# Patient Record
Sex: Male | Born: 1975 | Race: White | Hispanic: No | Marital: Single | Smoking: Never smoker
Health system: Southern US, Community
[De-identification: ages and names within clinical notes are randomized; demographics above are authoritative.]

## PROBLEM LIST (undated history)

## (undated) DIAGNOSIS — M5440 Lumbago with sciatica, unspecified side: Secondary | ICD-10-CM

## (undated) HISTORY — PX: WISDOM TOOTH EXTRACTION: SHX21

## (undated) HISTORY — DX: Lumbago with sciatica, unspecified side: M54.40

---

## 2005-08-26 ENCOUNTER — Ambulatory Visit: Payer: Self-pay | Admitting: Internal Medicine

## 2005-09-01 ENCOUNTER — Ambulatory Visit: Payer: Self-pay | Admitting: Internal Medicine

## 2006-03-01 ENCOUNTER — Ambulatory Visit: Payer: Self-pay | Admitting: Internal Medicine

## 2006-03-10 ENCOUNTER — Ambulatory Visit: Payer: Self-pay | Admitting: Internal Medicine

## 2006-03-22 ENCOUNTER — Ambulatory Visit: Payer: Self-pay | Admitting: Internal Medicine

## 2006-03-23 ENCOUNTER — Encounter: Payer: Self-pay | Admitting: Internal Medicine

## 2006-04-27 ENCOUNTER — Ambulatory Visit: Payer: Self-pay | Admitting: Internal Medicine

## 2006-04-27 ENCOUNTER — Encounter: Payer: Self-pay | Admitting: Internal Medicine

## 2010-07-03 ENCOUNTER — Ambulatory Visit: Payer: Self-pay | Admitting: Internal Medicine

## 2010-07-03 DIAGNOSIS — M549 Dorsalgia, unspecified: Secondary | ICD-10-CM | POA: Insufficient documentation

## 2010-07-03 DIAGNOSIS — R079 Chest pain, unspecified: Secondary | ICD-10-CM

## 2010-07-07 ENCOUNTER — Ambulatory Visit: Payer: Self-pay | Admitting: Internal Medicine

## 2010-12-27 DIAGNOSIS — M5440 Lumbago with sciatica, unspecified side: Secondary | ICD-10-CM

## 2010-12-27 HISTORY — PX: OTHER SURGICAL HISTORY: SHX169

## 2010-12-27 HISTORY — DX: Lumbago with sciatica, unspecified side: M54.40
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
## 2011-01-28 NOTE — Progress Notes (Signed)
Summary: [facility]   Imported By: Lanelle Bal 07/10/2010 09:47:20  _____________________________________________________________________  External Attachment:    Type:   Image     Comment:   External Document

## 2011-11-21 ENCOUNTER — Emergency Department (HOSPITAL_BASED_OUTPATIENT_CLINIC_OR_DEPARTMENT_OTHER)
Admission: EM | Admit: 2011-11-21 | Discharge: 2011-11-21 | Disposition: A | Discharge status: Home / Self Care | Payer: BC Managed Care – PPO | Attending: Emergency Medicine | Admitting: Emergency Medicine

## 2011-11-21 ENCOUNTER — Encounter: Payer: Self-pay | Admitting: Emergency Medicine

## 2011-11-21 DIAGNOSIS — M5430 Sciatica, unspecified side: Secondary | ICD-10-CM

## 2011-11-21 DIAGNOSIS — M549 Dorsalgia, unspecified: Secondary | ICD-10-CM | POA: Insufficient documentation

## 2011-11-21 MED ORDER — OXYCODONE-ACETAMINOPHEN 5-325 MG PO TABS: 2.0000 | ORAL_TABLET | ORAL | Status: AC | PRN

## 2011-11-21 MED ORDER — CYCLOBENZAPRINE HCL 10 MG PO TABS: 10.0000 mg | ORAL_TABLET | Freq: Two times a day (BID) | ORAL | Status: AC | PRN

## 2011-11-21 MED ORDER — CYCLOBENZAPRINE HCL 10 MG PO TABS
10.0000 mg | ORAL_TABLET | Freq: Once | ORAL | Status: AC
  Administered 2011-11-21: 10 mg via ORAL
  Filled 2011-11-21: qty 1

## 2011-11-21 MED ORDER — OXYCODONE-ACETAMINOPHEN 5-325 MG PO TABS
1.0000 | ORAL_TABLET | Freq: Once | ORAL | Status: AC
  Administered 2011-11-21: 1 via ORAL
  Filled 2011-11-21: qty 1

## 2011-11-21 NOTE — ED Provider Notes (Signed)
Medical screening examination/treatment/procedure(s) were performed by non-physician practitioner and as supervising physician I was immediately available for consultation/collaboration.   Britney Newstrom, MD 11/21/11 2143 

## 2011-11-21 NOTE — ED Provider Notes (Signed)
History     CSN: [id number] Arrival date & time: 11/21/2011  6:08 PM   First MD Initiated Contact with Patient 11/21/11 1845      Chief Complaint  Patient presents with  . Back Pain    low back pain with radiation to l leg    (Consider location/radiation/quality/duration/timing/severity/associated sxs/prior treatment) Patient is a 35 y.o. male presenting with back pain. The history is provided by the patient. No language interpreter was used.  Back Pain  This is a new problem. The current episode started more than 2 days ago. The problem occurs constantly. The problem has been rapidly worsening. The pain is associated with no known injury. The pain is present in the gluteal region. The quality of the pain is described as shooting. The pain radiates to the left thigh. The pain is moderate. The symptoms are aggravated by bending and certain positions. The pain is the same all the time. Pertinent negatives include no numbness, no bowel incontinence, no perianal numbness, no dysuria, no tingling and no weakness. He has tried NSAIDs for the symptoms. The treatment provided mild relief.    History reviewed. No pertinent past medical history.  History reviewed. No pertinent past surgical history.  History reviewed. No pertinent family history.  History  Substance Use Topics  . Smoking status: Never Smoker   . Smokeless tobacco: Not on file  . Alcohol Use: No      Review of Systems  Gastrointestinal: Negative for bowel incontinence.  Genitourinary: Negative for dysuria.  Musculoskeletal: Positive for back pain.  Neurological: Negative for tingling, weakness and numbness.  All other systems reviewed and are negative.    Allergies  Aloe vera; Amoxicillin; and Penicillins  Home Medications   Current Outpatient Rx  Name Route Sig Dispense Refill  . IBUPROFEN 800 MG PO TABS Oral Take 800 mg by mouth every 8 (eight) hours as needed.        BP 148/90  Pulse 100  Temp 98.6 F  (37 C)  Resp 22  Wt 185 lb (83.915 kg)  SpO2 98%  Physical Exam  Nursing note and vitals reviewed. Constitutional: He is oriented to person, place, and time. He appears well-developed and well-nourished.  HENT:  Head: Normocephalic and atraumatic.  Cardiovascular: Normal rate and regular rhythm.   Pulmonary/Chest: Effort normal and breath sounds normal.  Musculoskeletal: Normal range of motion.       Pt has left sciatic notch tenderness  Neurological: He is alert and oriented to person, place, and time.    ED Course  Procedures (including critical care time)  Labs Reviewed - No data to display No results found.   1. Sciatica       MDM  Pt is not having any neuro deficits:pt is okay to follow up as needed:no imagining needed        Teressa Lower, NP 11/21/11 1904

## 2011-11-21 NOTE — ED Notes (Signed)
Pt reports low back pain with radiation to L leg denies any Trauma

## 2011-11-21 NOTE — Discharge Instructions (Signed)
Back Exercises  Back exercises help treat and prevent back injuries. The goal is to increase your strength in your belly (abdominal) and back muscles. These exercises can also help with flexibility. Start these exercises when told by your doctor.  HOME CARE  Back exercises include:     Pelvic Tilt.  · Lie on your back with your knees bent. Tilt your pelvis until the lower part of your back is against the floor. Hold this position 5 to 10 sec. Repeat this exercise 5 to 10 times.      Knee to Chest.  · Pull 1 knee up against your chest and hold for 20 to 30 seconds. Repeat this with the other knee. This may be done with the other leg straight or bent, whichever feels better. Then, pull both knees up against your chest.      Sit-Ups or Curl-Ups.  · Bend your knees 90 degrees. Start with tilting your pelvis, and do a partial, slow sit-up. Only lift your upper half 30 to 45 degrees off the floor. Take at least 2 to 3 seonds for each sit-up. Do not do sit-ups with your knees out straight. If partial sit-ups are difficult, simply do the above but with only tightening your belly (abdominal) muscles and holding it as told.      Hip-Lift.  · Lie on your back with your knees flexed 90 degrees. Push down with your feet and shoulders as you raise your hips 2 inches off the floor. Hold for 10 seconds, repeat 5 to 10 times.      Back Arches.  · Lie on your stomach. Prop yourself up on bent elbows. Slowly press on your hands, causing an arch in your low back. Repeat 3 to 5 times.      Shoulder-Lifts.  · Lie face down with arms beside your body. Keep hips and belly pressed to floor as you slowly lift your head and shoulders off the floor.   Do not overdo your exercises. Be careful in the beginning. Exercises may cause you some mild back discomfort. If the pain lasts for more than 15 minutes, stop the exercises until you see your doctor. Improvement with exercise for back problems is slow.   Document Released: 01/15/2011 Document  Revised: 08/25/2011 Document Reviewed: 01/15/2011  [employer]® Patient Information ©2012 [employer].

## 2011-11-21 NOTE — ED Notes (Signed)
Care plan and safe use of Meds reviewed 

## 2011-12-22 ENCOUNTER — Encounter: Payer: Self-pay | Admitting: Internal Medicine

## 2012-01-13 ENCOUNTER — Encounter: Payer: Self-pay | Admitting: Internal Medicine

## 2012-01-13 ENCOUNTER — Ambulatory Visit (INDEPENDENT_AMBULATORY_CARE_PROVIDER_SITE_OTHER): Payer: BC Managed Care – PPO | Admitting: Internal Medicine

## 2012-01-13 VITALS — BP 124/80 | HR 61 | Temp 98.1°F | Resp 12 | Ht 72.0 in | Wt 183.2 lb

## 2012-01-13 DIAGNOSIS — Z23 Encounter for immunization: Secondary | ICD-10-CM

## 2012-01-13 DIAGNOSIS — Z0000 Encounter for general adult medical examination without abnormal findings: Secondary | ICD-10-CM

## 2012-01-13 DIAGNOSIS — J209 Acute bronchitis, unspecified: Secondary | ICD-10-CM

## 2012-01-13 DIAGNOSIS — R062 Wheezing: Secondary | ICD-10-CM

## 2012-01-13 LAB — LIPID PANEL
Cholesterol: 184 mg/dL (ref 0–200)
LDL Cholesterol: 115 mg/dL — ABNORMAL HIGH (ref 0–99)
VLDL: 15 mg/dL (ref 0.0–40.0)

## 2012-01-13 LAB — CBC WITH DIFFERENTIAL/PLATELET
Basophils Relative: 0.6 % (ref 0.0–3.0)
Eosinophils Absolute: 0.2 10*3/uL (ref 0.0–0.7)
HCT: 43.9 % (ref 39.0–52.0)
Hemoglobin: 14.9 g/dL (ref 13.0–17.0)
Lymphocytes Relative: 33.3 % (ref 12.0–46.0)
Lymphs Abs: 2.1 10*3/uL (ref 0.7–4.0)
MCHC: 33.9 g/dL (ref 30.0–36.0)
MCV: 89.4 fl (ref 78.0–100.0)
Monocytes Relative: 8.6 % (ref 3.0–12.0)
Neutro Abs: 3.5 10*3/uL (ref 1.4–7.7)
RBC: 4.91 Mil/uL (ref 4.22–5.81)
RDW: 13.1 % (ref 11.5–14.6)
WBC: 6.3 10*3/uL (ref 4.5–10.5)

## 2012-01-13 LAB — BASIC METABOLIC PANEL
BUN: 19 mg/dL (ref 6–23)
Chloride: 100 mEq/L (ref 96–112)
Glucose, Bld: 87 mg/dL (ref 70–99)
Potassium: 4.4 mEq/L (ref 3.5–5.1)
Sodium: 139 mEq/L (ref 135–145)

## 2012-01-13 LAB — HEPATIC FUNCTION PANEL
ALT: 61 U/L — ABNORMAL HIGH (ref 0–53)
AST: 35 U/L (ref 0–37)
Albumin: 4.5 g/dL (ref 3.5–5.2)
Alkaline Phosphatase: 129 U/L — ABNORMAL HIGH (ref 39–117)

## 2012-01-13 LAB — TSH: TSH: 0.4 u[IU]/mL (ref 0.35–5.50)

## 2012-01-13 MED ORDER — PREDNISONE 20 MG PO TABS: 20.0000 mg | ORAL_TABLET | Freq: Two times a day (BID) | ORAL | Status: AC

## 2012-01-13 MED ORDER — AZITHROMYCIN 250 MG PO TABS: ORAL_TABLET | ORAL | Status: AC

## 2012-01-13 MED ORDER — FLUTICASONE-SALMETEROL 250-50 MCG/DOSE IN AEPB: 1.0000 | INHALATION_SPRAY | Freq: Two times a day (BID) | RESPIRATORY_TRACT | Status: DC

## 2012-01-13 NOTE — Patient Instructions (Signed)
Plain Mucinex for thick secretions ;force NON dairy fluids . Use a Neti pot daily as needed for sinus congestion  Preventive Health Care: Exercise at least 30-45 minutes a day,  3-4 days a week.  Eat a low-fat diet with lots of fruits and vegetables, up to 7-9 servings per day. Consume less than 40 grams of sugar per day from foods & drinks with High Fructose Corn Sugar as # 1,2,3 or # 4 on label. Advair sample one inhalation every 12 hours; gargle and spit after use

## 2012-01-13 NOTE — Progress Notes (Signed)
Subjective:    Patient ID: Elijah Durham, male    DOB: 07-09-1976, 36 y.o.   MRN: [id number]  HPI  Elijah Durham  is here for a physical;acute issues include recent bronchitis & fatigue for 1 year intermittently.     Review of Systems Patient reports no  vision/ hearing changes,anorexia, weight change, fever ,adenopathy, persistant / recurrent hoarseness, swallowing issues, chest pain,palpitations, edema,persistant / recurrent cough, hemoptysis, dyspnea(rest, exertional, paroxysmal nocturnal), gastrointestinal  bleeding (melena, rectal bleeding), abdominal pain, excessive heart burn, GU symptoms( dysuria, hematuria, pyuria, voiding/incontinence  issues) syncope, focal weakness, memory loss,numbness & tingling, skin/hair/nail changes,depression, anxiety, or abnormal bruising/bleeding.  He received epidural steroids in low back in late 2012 for lumbar radiculopathy with sciatica. He recently had symptoms of sinusitis which resolved; he's had residual purulent sputum for the last 2 weeks. He denies a history of asthma.         Objective:   Physical Exam Gen.: Healthy and well-nourished in appearance. Alert, appropriate and cooperative throughout exam. Head: Normocephalic without obvious abnormalities  Eyes: No corneal or conjunctival inflammation noted. Pupils equal round reactive to light and accommodation. Fundal exam is benign without hemorrhages, exudate, papilledema. Extraocular motion intact. Vision grossly normal. Ears: External  ear exam reveals no significant lesions or deformities. Canals clear .TMs normal. Hearing is grossly normal bilaterally. Nose: External nasal exam reveals no deformity or inflammation. Nasal mucosa are pink and moist. No lesions or exudates noted.  Mouth: Oral mucosa and oropharynx reveal no lesions or exudates. Teeth in good repair. Neck: No deformities, masses, or tenderness noted. Range of motion & Thyroid normal. Lungs: He exhibits intermittent coughing; he has  low-grade musical wheezing diffusely through out both lung fields   No  increased work of breathing. Heart: Normal rate and rhythm. Normal S1 and S2. No gallop, click, or rub. No murmur. Abdomen: Bowel sounds normal; abdomen soft and nontender. No masses, organomegaly or hernias noted. Genitalia/DRE: Genital exam is unremarkable today. Prostate is upper limits of normal without asymmetry, nodularity, or induration.                                                                                  Musculoskeletal/extremities: No deformity or scoliosis noted of  the thoracic or lumbar spine. No clubbing, cyanosis, edema, or deformity noted. Range of motion  normal .Tone & strength  normal.Joints normal. Nail health  good. Vascular: Carotid, radial artery, dorsalis pedis and  posterior tibial pulses are full and equal. No bruits present. Neurologic: Alert and oriented x3. Deep tendon reflexes symmetrical and normal.          Skin: Intact without suspicious lesions or rashes. Early Dupuytren's  contraction suggested on the left palm. Scattered nevi over his back. Epidermal inclusion cyst upper back Lymph: No cervical, axillary, or inguinal lymphadenopathy present. Psych: Mood and affect are normal. Normally interactive  Assessment & Plan:  #1 comprehensive physical exam.  #2 intermittent fatigue by history   #3 bronchitis, acute with bronchospasm Plan: see Orders

## 2012-01-26 ENCOUNTER — Encounter: Payer: Self-pay | Admitting: Internal Medicine

## 2013-02-10 ENCOUNTER — Other Ambulatory Visit: Payer: Self-pay

## 2013-11-01 ENCOUNTER — Other Ambulatory Visit: Payer: Self-pay

## 2014-08-05 ENCOUNTER — Ambulatory Visit (INDEPENDENT_AMBULATORY_CARE_PROVIDER_SITE_OTHER): Payer: BC Managed Care – PPO | Admitting: Sports Medicine

## 2014-08-05 ENCOUNTER — Encounter: Payer: Self-pay | Admitting: Sports Medicine

## 2014-08-05 VITALS — BP 125/82 | HR 54 | Ht 71.0 in | Wt 187.0 lb

## 2014-08-05 DIAGNOSIS — D239 Other benign neoplasm of skin, unspecified: Secondary | ICD-10-CM | POA: Diagnosis not present

## 2014-08-05 DIAGNOSIS — Z0000 Encounter for general adult medical examination without abnormal findings: Secondary | ICD-10-CM | POA: Diagnosis not present

## 2014-08-05 DIAGNOSIS — D229 Melanocytic nevi, unspecified: Secondary | ICD-10-CM | POA: Insufficient documentation

## 2014-08-05 DIAGNOSIS — Z299 Encounter for prophylactic measures, unspecified: Secondary | ICD-10-CM

## 2014-08-05 NOTE — Assessment & Plan Note (Signed)
Checking routine blood work, he does have some symptoms of low T.

## 2014-08-05 NOTE — Assessment & Plan Note (Signed)
Gets mole mapping at [facility]. He also has a couple of sebaceous cysts. Happy to excise these at his leisure.

## 2014-08-05 NOTE — Progress Notes (Signed)
  Subjective:    CC: Establish care, physical.   HPI:  This is a very healthy 38 year old male, he desires a physical, and routine blood screening, he has no complaints.  Past medical history, Surgical history, Family history not pertinant except as noted below, Social history, Allergies, and medications have been entered into the medical record, reviewed, and no changes needed.   Review of Systems: No headache, visual changes, nausea, vomiting, diarrhea, constipation, dizziness, abdominal pain, skin rash, fevers, chills, night sweats, swollen lymph nodes, weight loss, chest pain, body aches, joint swelling, muscle aches, shortness of breath, mood changes, visual or auditory hallucinations.  Objective:    General: Well Developed, well nourished, and in no acute distress.  Neuro: Alert and oriented x3, extra-ocular muscles intact, sensation grossly intact. Cranial nerves II through XII are intact, motor, sensory, and coordinative functions are all intact. HEENT: Normocephalic, atraumatic, pupils equal round reactive to light, neck supple, no masses, no lymphadenopathy, thyroid nonpalpable. Oropharynx, nasopharynx, external ear canals are unremarkable. Skin: Warm and dry, no rashes noted.  Cardiac: Regular rate and rhythm, no murmurs rubs or gallops.  Respiratory: Clear to auscultation bilaterally. Not using accessory muscles, speaking in full sentences.  Abdominal: Soft, nontender, nondistended, positive bowel sounds, no masses, no organomegaly.  Musculoskeletal: Shoulder, elbow, wrist, hip, knee, ankle stable, and with full range of motion.  Impression and Recommendations:    The patient was counselled, risk factors were discussed, anticipatory guidance given.

## 2014-08-06 LAB — CBC
HCT: 45.1 % (ref 39.0–52.0)
Hemoglobin: 15.4 g/dL (ref 13.0–17.0)
MCH: 29.4 pg (ref 26.0–34.0)
MCHC: 34.1 g/dL (ref 30.0–36.0)
MCV: 86.2 fL (ref 78.0–100.0)
Platelets: 207 10*3/uL (ref 150–400)
RBC: 5.23 MIL/uL (ref 4.22–5.81)
RDW: 14 % (ref 11.5–15.5)
WBC: 5.1 10*3/uL (ref 4.0–10.5)

## 2014-08-07 LAB — LIPID PANEL
Cholesterol: 188 mg/dL (ref 0–200)
HDL: 59 mg/dL (ref >39)
LDL Cholesterol: 111 mg/dL — ABNORMAL HIGH (ref 0–99)
Total CHOL/HDL Ratio: 3.2 ratio
Triglycerides: 92 mg/dL (ref <150)
VLDL: 18 mg/dL (ref 0–40)

## 2014-08-07 LAB — COMPREHENSIVE METABOLIC PANEL
ALT: 52 U/L (ref 0–53)
AST: 28 U/L (ref 0–37)
Albumin: 4.8 g/dL (ref 3.5–5.2)
CO2: 29 mEq/L (ref 19–32)
Calcium: 9.4 mg/dL (ref 8.4–10.5)
Chloride: 100 mEq/L (ref 96–112)
Creat: 1.09 mg/dL (ref 0.50–1.35)
Potassium: 4.6 mEq/L (ref 3.5–5.3)
Total Protein: 7.1 g/dL (ref 6.0–8.3)

## 2014-08-07 LAB — HEMOGLOBIN A1C
Hgb A1c MFr Bld: 5.5 % (ref <5.7)
Mean Plasma Glucose: 111 mg/dL (ref <117)

## 2014-08-07 LAB — TSH: TSH: 1.656 u[IU]/mL (ref 0.350–4.500)

## 2014-08-07 LAB — VITAMIN D 25 HYDROXY (VIT D DEFICIENCY, FRACTURES): Vit D, 25-Hydroxy: 41 ng/mL (ref 30–89)

## 2014-08-07 LAB — COMPREHENSIVE METABOLIC PANEL WITH GFR
Alkaline Phosphatase: 66 U/L (ref 39–117)
BUN: 18 mg/dL (ref 6–23)
Glucose, Bld: 101 mg/dL — ABNORMAL HIGH (ref 70–99)
Sodium: 139 meq/L (ref 135–145)
Total Bilirubin: 0.6 mg/dL (ref 0.2–1.2)

## 2014-08-07 LAB — TESTOSTERONE: Testosterone: 344 ng/dL (ref 300–890)

## 2015-01-14 ENCOUNTER — Encounter: Payer: Self-pay | Admitting: Sports Medicine

## 2015-01-14 ENCOUNTER — Ambulatory Visit (INDEPENDENT_AMBULATORY_CARE_PROVIDER_SITE_OTHER): Payer: BLUE CROSS/BLUE SHIELD

## 2015-01-14 ENCOUNTER — Ambulatory Visit (INDEPENDENT_AMBULATORY_CARE_PROVIDER_SITE_OTHER): Payer: BLUE CROSS/BLUE SHIELD | Admitting: Sports Medicine

## 2015-01-14 VITALS — BP 120/56 | HR 83 | Wt 189.0 lb

## 2015-01-14 DIAGNOSIS — M549 Dorsalgia, unspecified: Secondary | ICD-10-CM

## 2015-01-14 DIAGNOSIS — M5416 Radiculopathy, lumbar region: Secondary | ICD-10-CM

## 2015-01-14 MED ORDER — GABAPENTIN 300 MG PO CAPS: ORAL_CAPSULE | ORAL | Status: DC

## 2015-01-14 MED ORDER — MELOXICAM 15 MG PO TABS: ORAL_TABLET | ORAL | Status: DC

## 2015-01-14 NOTE — Assessment & Plan Note (Signed)
Right-sided S1. Meloxicam, gabapentin at bedtime, formal physical therapy, x-rays. Return in a month, MRI no better.

## 2015-01-14 NOTE — Progress Notes (Signed)
  Subjective:    CC:  Low back pain  HPI: This is a pleasant 39 year old male, for the past several years he has had on and off pain in his low back, he did have a severe episode of left-sided back pain going down his left leg which resolved. More recently he's had right-sided pain going down the right leg in an S1 distribution, moderate, persistent, worse with sitting, flexion, Valsalva, no bowel or bladder dysfunction or saddle numbness.  Past medical history, Surgical history, Family history not pertinant except as noted below, Social history, Allergies, and medications have been entered into the medical record, reviewed, and no changes needed.   Review of Systems: No fevers, chills, night sweats, weight loss, chest pain, or shortness of breath.   Objective:    General: Well Developed, well nourished, and in no acute distress.  Neuro: Alert and oriented x3, extra-ocular muscles intact, sensation grossly intact.  HEENT: Normocephalic, atraumatic, pupils equal round reactive to light, neck supple, no masses, no lymphadenopathy, thyroid nonpalpable.  Skin: Warm and dry, no rashes. Cardiac: Regular rate and rhythm, no murmurs rubs or gallops, no lower extremity edema.  Respiratory: Clear to auscultation bilaterally. Not using accessory muscles, speaking in full sentences. Back Exam:  Inspection: Unremarkable  Motion: Flexion 45 deg, Extension 45 deg, Side Bending to 45 deg bilaterally,  Rotation to 45 deg bilaterally  SLR laying: Negative  XSLR laying: Negative  Palpable tenderness: None. FABER: negative. Sensory change: Gross sensation intact to all lumbar and sacral dermatomes.  Reflexes: 2+ at both patellar tendons, 2+ at achilles tendons, Babinski's downgoing.  Strength at foot  Plantar-flexion: 5/5 Dorsi-flexion: 5/5 Eversion: 5/5 Inversion: 5/5  Leg strength  Quad: 5/5 Hamstring: 5/5 Hip flexor: 5/5 Hip abductors: 5/5  Gait unremarkable.  Impression and Recommendations:

## 2015-01-29 ENCOUNTER — Ambulatory Visit (INDEPENDENT_AMBULATORY_CARE_PROVIDER_SITE_OTHER): Payer: BLUE CROSS/BLUE SHIELD | Admitting: Physical Therapy

## 2015-01-29 DIAGNOSIS — M25659 Stiffness of unspecified hip, not elsewhere classified: Secondary | ICD-10-CM

## 2015-01-29 DIAGNOSIS — M5416 Radiculopathy, lumbar region: Secondary | ICD-10-CM

## 2015-02-04 ENCOUNTER — Encounter (INDEPENDENT_AMBULATORY_CARE_PROVIDER_SITE_OTHER): Payer: BLUE CROSS/BLUE SHIELD | Admitting: Physical Therapy

## 2015-02-04 DIAGNOSIS — M5416 Radiculopathy, lumbar region: Secondary | ICD-10-CM | POA: Diagnosis not present

## 2015-02-04 DIAGNOSIS — M25659 Stiffness of unspecified hip, not elsewhere classified: Secondary | ICD-10-CM

## 2015-02-12 ENCOUNTER — Ambulatory Visit (INDEPENDENT_AMBULATORY_CARE_PROVIDER_SITE_OTHER): Payer: BLUE CROSS/BLUE SHIELD | Admitting: Physical Therapy

## 2015-02-12 ENCOUNTER — Encounter: Payer: Self-pay | Admitting: Physical Therapy

## 2015-02-12 DIAGNOSIS — M25651 Stiffness of right hip, not elsewhere classified: Secondary | ICD-10-CM | POA: Diagnosis not present

## 2015-02-12 NOTE — Patient Instructions (Signed)
Add kneeling hip flexor stretch and piriformis stretch, as well as a trial of ice to R hip (x 15 min) to HEP.

## 2015-02-12 NOTE — Therapy (Signed)
[address] [place], [place], [address] Phone: [phone]   Fax:  [phone]  Physical Therapy Treatment  Patient Details  Name: Elijah Durham MRN: [id number] Date of Birth: Nov 02, 1976 Referring Provider:  Silverio Decamp,*  Encounter Date: 02/12/2015      PT End of Session - 02/12/15 0809    Visit Number 3   Number of Visits 4   Date for PT Re-Evaluation 02/19/15   PT Start Time 0807   PT Stop Time 0906   PT Time Calculation (min) 59 min      Past Medical History  Diagnosis Date  . Low back pain with sciatica 2012    Dr Micheline Chapman    Past Surgical History  Procedure Laterality Date  . Wisdom tooth extraction    . Epidural steroid  2012    Dr Micheline Chapman    There were no vitals taken for this visit.  Visit Diagnosis:  Stiffness of joint, pelvic region and thigh, right      [facility] PT Assessment - 02/12/15 0001    Assessment   Medical Diagnosis M54.16    Flexibility   Hamstrings --  Rt 58 degrees, Lt 57 degrees            Subjective Assessment - 02/12/15 0809    Symptoms feels about the same, no change since last visit.    Limitations Standing  only tolerate standing 5 min.    Currently in Pain? Yes   Pain Score 3    Pain Location Leg   Pain Orientation Right   Pain Descriptors / Indicators Burning;Aching   Pain Radiating Towards R toes    Aggravating Factors  standing   Pain Relieving Factors sitting         [facility] Adult PT Treatment/Exercise - 02/12/15 0001    Lumbar Exercises: Stretches   Hip Flexor Stretch  Hamstring stretch  30 seconds   30 sec.      Piriformis Stretch 2 reps;30 seconds   Lumbar Exercises: Aerobic   Stationary Bike NuStep: 6 min, level 5    Lumbar Exercises: Supine   Bridge 5 reps   Lumbar Exercises: Prone   Opposite Arm/Leg Raise 10 reps. VC delivered for form. Repeated 5 reps each leg with improved form.    Other Prone Lumbar Exercises Prone  press ups x 10 reps   Other Prone Lumbar Exercises Childs pose, 30 sec, 3 reps with lateral trunk flex   Modalities   Modalities Ultrasound  Combo treatment.    Ultrasound   Ultrasound Location R piriformis    Ultrasound Parameters 100%, 1.0 mHz, 1.3 w/cm2 x 8 min    Ultrasound Goals Pain   Manual Therapy   Manual Therapy Other (comment)  Contract relax with R/L hamstrings x 3 reps each leg.   Other Manual Therapy --  Muscle energy to R ant rotated hip in supine, 2 trials.  Trigger point release to R iliopsoas and R piriformis. (Noted some crepitus at R hip flexor with hip flexion in supine. Pt reported increased numbness with pressure to piriformis attachment towards sacrum; resolved when pressure released).  Pt reported increased "ache" in right hip after being in prone position for several minutes; resolved with position change.           PT Education - 02/12/15 [phone]    Education provided Yes   Education Details Added piriformis and hip flexor stretch to HEP.    Person(s) Educated Patient  Methods Explanation;Demonstration   Comprehension Returned demonstration;Verbalized understanding         PT Long Term Goals - 02/12/15 1357    PT LONG TERM GOAL #1   Title demonstrate and/or verbalize techniques to reduce the risk of re-injury to include information on posture and body mechanics.    Time 7   Period Days   Status Achieved  02/12/15   PT LONG TERM GOAL #2   Title be independent with advanced HEP    Time 7   Period Days   Status On-going   PT LONG TERM GOAL #3   Title report pain decrease to allow him to return to jumping sports and volleyball    Time 7   Period Days   Status On-going   PT LONG TERM GOAL #4   Title increase flexibility of bilat hamstrings to 65 degrees    Time 7   Status On-going   PT LONG TERM GOAL #5   Title improve FOTO to =/<26% limitation    Time 7   Period Days   Status On-going           Plan - 02/12/15 7106    Clinical  Impression Statement Pt demonstrated minimal change in hamstring length and no change in symptoms.  Pt has met LTG #1 today.  Pt tolerated manual therapy, exercises and combo [place] today without increase in symptoms.    PT Frequency 1x / week   PT Duration 3 weeks   PT Next Visit Plan Continue per plan of care.  Reassess goals and need for renewal next visit (POC ends 02/19/15).     Consulted and Agree with Plan of Care Patient        Problem List Patient Active Problem List   Diagnosis Date Noted  . Right lumbar radiculopathy 01/14/2015  . Multiple benign nevi 08/05/2014  . Preventive measure 08/05/2014     Kerin Perna, PTA 02/12/2015 4:38 PM  [address] [place], [place], [address] Phone: [phone]   Fax:  [phone]

## 2015-02-14 ENCOUNTER — Encounter: Payer: Self-pay | Admitting: Sports Medicine

## 2015-02-14 ENCOUNTER — Ambulatory Visit (INDEPENDENT_AMBULATORY_CARE_PROVIDER_SITE_OTHER): Payer: BLUE CROSS/BLUE SHIELD | Admitting: Sports Medicine

## 2015-02-14 VITALS — BP 125/80 | HR 71 | Ht 71.0 in | Wt 192.0 lb

## 2015-02-14 DIAGNOSIS — M5416 Radiculopathy, lumbar region: Secondary | ICD-10-CM | POA: Diagnosis not present

## 2015-02-14 NOTE — Progress Notes (Signed)
  Subjective:    CC: Follow-up  HPI: Right lumbar radiculitis: Persistent symptoms, worsened despite physical therapy, gabapentin, he is gone up to gabapentin only at 300 mg at bedtime, radicular symptoms are now also referable to the L4, L5 and occasionally S1 roots. Moderate, persistent. No bowel or bladder dysfunction or saddle numbness.  Past medical history, Surgical history, Family history not pertinant except as noted below, Social history, Allergies, and medications have been entered into the medical record, reviewed, and no changes needed.   Review of Systems: No fevers, chills, night sweats, weight loss, chest pain, or shortness of breath.   Objective:    General: Well Developed, well nourished, and in no acute distress.  Neuro: Alert and oriented x3, extra-ocular muscles intact, sensation grossly intact.  HEENT: Normocephalic, atraumatic, pupils equal round reactive to light, neck supple, no masses, no lymphadenopathy, thyroid nonpalpable.  Skin: Warm and dry, no rashes. Cardiac: Regular rate and rhythm, no murmurs rubs or gallops, no lower extremity edema.  Respiratory: Clear to auscultation bilaterally. Not using accessory muscles, speaking in full sentences.   Impression and Recommendations:

## 2015-02-14 NOTE — Assessment & Plan Note (Signed)
Persistent right sided radicular symptoms, this time occasionally an L4, L5, and occasionally S1 distributions. There was L4-5 and L5-S1 narrowing on x-rays. At this point he is worse despite physical therapy, steroids, gabapentin, and NSAIDs. We are going to proceed with an MRI for interventional injection planning, return to see me for MRI results.

## 2015-02-18 ENCOUNTER — Ambulatory Visit (INDEPENDENT_AMBULATORY_CARE_PROVIDER_SITE_OTHER): Payer: BLUE CROSS/BLUE SHIELD | Admitting: Physical Therapy

## 2015-02-18 ENCOUNTER — Encounter: Payer: Self-pay | Admitting: Physical Therapy

## 2015-02-18 DIAGNOSIS — M25651 Stiffness of right hip, not elsewhere classified: Secondary | ICD-10-CM

## 2015-02-18 NOTE — Therapy (Signed)
[place], [place], [address] Phone: [phone]   Fax:  [phone]  Physical Therapy Treatment  Patient Details  Name: Elijah Durham MRN: [id number] Date of Birth: 1976-04-01 Referring Provider:  Silverio Decamp,*  Encounter Date: 02/18/2015      PT End of Session - 02/18/15 0802    Visit Number 4   Number of Visits 4   Date for PT Re-Evaluation 03/18/15   PT Start Time 0805   PT Stop Time 0859   PT Time Calculation (min) 54 min   Activity Tolerance Patient tolerated treatment well      Past Medical History  Diagnosis Date  . Low back pain with sciatica 2012    Dr Micheline Chapman    Past Surgical History  Procedure Laterality Date  . Wisdom tooth extraction    . Epidural steroid  2012    Dr Micheline Chapman    There were no vitals taken for this visit.  Visit Diagnosis:  Stiffness of joint, pelvic region and thigh, right - Plan: PT plan of care cert/re-cert      Subjective Assessment - 02/18/15 0802    Symptoms Saw MD last week, will have an MRI and schedule injections. Day is better than night   How long can you sit comfortably? No limitation with sitting   How long can you stand comfortably? Rt LE goes numb with about 2-3 sec of standing   How long can you walk comfortably? able to walk however has the numbness   Diagnostic tests scheduling MRI   Currently in Pain? Yes   Pain Score 2    Pain Location Back   Pain Descriptors / Indicators Dull   Pain Type Acute pain   Pain Onset More than a month ago   Pain Frequency Intermittent          [facility] PT Assessment - 02/18/15 0001    Assessment   Medical Diagnosis M54.16   Observation/Other Assessments   Focus on Therapeutic Outcomes (FOTO)  48% limited   Flexibility   Hamstrings --  Lt 72 degrees, Rt 78 degrees                  [facility] Adult PT Treatment/Exercise - 02/18/15 0001    Lumbar Exercises: Stretches   ITB  Stretch 4 reps;20 seconds  with band in supine, cross body   Piriformis Stretch 2 reps;20 seconds  bilat   Lumbar Exercises: Seated   Long Arc Quad on [employer] sets;10 reps   Hip Flexion on [place] Both  2x10   Other Seated Lumbar Exercises on ball, BWD lean with arm movts   Lumbar Exercises: Prone   Other Prone Lumbar Exercises press ups x10   Lumbar Exercises: Quadruped   Straight Leg Raise 20 reps  VC to keep pelvis level   Modalities   Modalities Traction   Traction   Type of Traction Lumbar  supine   Min (lbs) 25   Max (lbs) 50  Pt wt 185#   Hold Time 60 sec   Rest Time 20 sec   Time 10'   Manual Therapy   Manual Therapy Joint mobilization   Joint Mobilization grade III to III+ mobs to L spine with focus on UPA Rt L4   Other Manual Therapy Rt piriformis passive release, prone hip flexion stretching.  PT Long Term Goals - 02/18/15 3903    PT LONG TERM GOAL #1   Status On-going   PT LONG TERM GOAL #2   Status On-going   PT LONG TERM GOAL #3   Status On-going   PT LONG TERM GOAL #4   Status Achieved   PT LONG TERM GOAL #5   Status On-going               Plan - 02/18/15 0092    Clinical Impression Statement Patient with no change in symptoms since last week.  Is able to perform HEP and pushes through his symptoms. Will start a trial of lumbar traction and see if this decreases numbness into Rt LE.    Pt will benefit from skilled therapeutic intervention in order to improve on the following deficits Impaired sensation;Decreased strength;Pain   Rehab Potential Good   PT Frequency 1x / week   PT Duration 4 weeks   PT Treatment/Interventions ADLs/Self Care Home Management;Moist Heat;Therapeutic activities;Patient/family education;Traction;Therapeutic exercise;Passive range of motion;Ultrasound;Manual techniques;Cryotherapy;Electrical Stimulation   PT Next Visit Plan assess response to lumbar traction   Consulted and Agree with  Plan of Care Patient        Problem List Patient Active Problem List   Diagnosis Date Noted  . Right lumbar radiculopathy 01/14/2015  . Multiple benign nevi 08/05/2014  . Preventive measure 08/05/2014    Etheleen Mayhew 02/18/2015, 8:53 AM  [facility] [place], [place], [address] Phone: [phone]   Fax:  [phone]

## 2015-02-19 ENCOUNTER — Telehealth: Payer: Self-pay

## 2015-02-19 NOTE — Telephone Encounter (Signed)
PA required for MRI lumbar spine without contrast - Approved - [id number] expires 03/19/2015

## 2015-02-26 ENCOUNTER — Ambulatory Visit (INDEPENDENT_AMBULATORY_CARE_PROVIDER_SITE_OTHER): Payer: BLUE CROSS/BLUE SHIELD | Admitting: Physical Therapy

## 2015-02-26 ENCOUNTER — Other Ambulatory Visit: Payer: Self-pay | Admitting: Sports Medicine

## 2015-02-26 DIAGNOSIS — M25651 Stiffness of right hip, not elsewhere classified: Secondary | ICD-10-CM | POA: Diagnosis not present

## 2015-02-26 DIAGNOSIS — Z1389 Encounter for screening for other disorder: Secondary | ICD-10-CM

## 2015-02-26 NOTE — Therapy (Signed)
[place], [place], [address] Phone: [phone]   Fax:  [phone]  Physical Therapy Treatment  Patient Details  Name: Elijah Durham MRN: [id number] Date of Birth: Jul 10, 1976 Referring Provider:  Silverio Decamp,*  Encounter Date: 02/26/2015      PT End of Session - 02/26/15 0804    Visit Number 5   Date for PT Re-Evaluation 03/18/15   PT Start Time 0803   PT Stop Time 0902   PT Time Calculation (min) 59 min   Activity Tolerance Patient tolerated treatment well      Past Medical History  Diagnosis Date  . Low back pain with sciatica 2012    Dr Micheline Chapman    Past Surgical History  Procedure Laterality Date  . Wisdom tooth extraction    . Epidural steroid  2012    Dr Micheline Chapman    There were no vitals taken for this visit.  Visit Diagnosis:  Stiffness of joint, pelvic region and thigh, right      Subjective Assessment - 02/26/15 0828    Symptoms No real change since last visit. Having MRI on Monday.    Limitations Standing   How long can you sit comfortably? No limitation with sitting   How long can you stand comfortably? Rt LE goes numb with about 2-3 sec of standing   How long can you walk comfortably? able to walk however has the numbness   Diagnostic tests MRI scheduled for Monday   Currently in Pain? Yes   Pain Score 2    Pain Location Buttocks   Pain Orientation Right   Pain Descriptors / Indicators Constant;Dull;Aching   Pain Radiating Towards Buttocks to toes    Aggravating Factors  standing, laying flat on back    Pain Relieving Factors sitting          [facility] PT Assessment - 02/26/15 0001    Assessment   Medical Diagnosis M54.16   Precautions   Precautions Other (comment)   Precaution Comments Latex allergy   Flexibility   Hamstrings Rt 70, Lt 62             [facility] Adult PT Treatment/Exercise - 02/26/15 0001    Lumbar Exercises: Stretches   ITB Stretch  4 reps;20 seconds  with band in supine, cross body   Piriformis Stretch 2 reps;20 seconds  bilat, seated    Lumbar Exercises: Aerobic   Elliptical Level 2. 4.5 min for warm up    Lumbar Exercises: Seated   Other Seated Lumbar Exercises On Red swiss ball, seated marching x 20. Opps arm/ leg x 20; lean back with UE at 130 deg flex  for core x  8 reps   Lumbar Exercises: Supine   Straight Leg Raise 20 reps  VC to slow down, engage core    Lumbar Exercises: Prone   Other Prone Lumbar Exercises press ups x10   Lumbar Exercises: Quadruped   Straight Leg Raise 10 reps  VC to keep pelvis level, slow down, 2 sets    Modalities   Modalities Traction   Traction   Type of Traction Lumbar   Min (lbs) 30   Max (lbs) 60  Pt wt 185#   Hold Time 60 sec   Rest Time 20 sec   Time 20 min                PT Education - 02/26/15 0956    Education provided Yes  Education Details Reviewed HEP    Person(s) Educated Patient   Methods Explanation;Demonstration   Comprehension Verbalized understanding;Returned demonstration             PT Long Term Goals - 02/26/15 0950    PT LONG TERM GOAL #1   Title demonstrate and/or verbalize techniques to reduce the risk of re-injury to include information on posture and body mechanics.    Time 3   Period Weeks   Status Achieved   PT LONG TERM GOAL #2   Title be independent with advanced HEP    Time 3   Period Weeks   Status Achieved   PT LONG TERM GOAL #3   Title report pain decrease to allow him to return to jumping sports and volleyball    Time 3   Period Weeks   Status Not Met   PT LONG TERM GOAL #4   Title increase flexibility of bilat hamstrings to 65 degrees    Time 3   Status Partially Met  see latest measurements.    PT LONG TERM GOAL #5   Title improve FOTO to =/<26% limitation    Time 3   Period Weeks   Status Not Met               Plan - 02/26/15 0948    Clinical Impression Statement Pt has reported no  change in symptoms over last few weeks. Pt tolerated lumbar traction, but reported no notable change in symptoms after/during. Pt has met LTG #1 and 2.  Pt has not met goals LTG# 3,4,5.    PT Treatment/Interventions ADLs/Self Care Home Management;Moist Heat;Therapeutic activities;Patient/family education;Traction;Therapeutic exercise;Passive range of motion;Ultrasound;Manual techniques;Cryotherapy;Electrical Stimulation   PT Next Visit Plan Spoke to supervising PT.  Will D/C and have pt return to MD for further testing.    Consulted and Agree with Plan of Care Patient        Problem List Patient Active Problem List   Diagnosis Date Noted  . Right lumbar radiculopathy 01/14/2015  . Multiple benign nevi 08/05/2014  . Preventive measure 08/05/2014    Kerin Perna, PTA 02/26/2015 9:57 AM   [facility] [place], [place], [address] Phone: [phone]   Fax:  [phone]

## 2015-02-26 NOTE — Therapy (Signed)
[place], [place], [address] Phone: [phone]   Fax:  [phone]  Physical Therapy Treatment  Patient Details  Name: Elijah Durham MRN: [id number] Date of Birth: [birth year] Referring Provider:  Silverio Decamp,*  Encounter Date: 02/26/2015      PT End of Session - 02/26/15 0804    Visit Number 5   Date for PT Re-Evaluation 03/18/15   PT Start Time 0803   PT Stop Time 0902   PT Time Calculation (min) 59 min   Activity Tolerance Patient tolerated treatment well      Past Medical History  Diagnosis Date  . Low back pain with sciatica 2012    Dr Micheline Chapman    Past Surgical History  Procedure Laterality Date  . Wisdom tooth extraction    . Epidural steroid  2012    Dr Micheline Chapman    There were no vitals taken for this visit.  Visit Diagnosis:  Stiffness of joint, pelvic region and thigh, right      Subjective Assessment - 02/26/15 0828    Symptoms No real change since last visit. Having MRI on Monday.    Limitations Standing   How long can you sit comfortably? No limitation with sitting   How long can you stand comfortably? Rt LE goes numb with about 2-3 sec of standing   How long can you walk comfortably? able to walk however has the numbness   Diagnostic tests MRI scheduled for Monday   Currently in Pain? Yes   Pain Score 2    Pain Location Buttocks   Pain Orientation Right   Pain Descriptors / Indicators Constant;Dull;Aching   Pain Radiating Towards Buttocks to toes    Aggravating Factors  standing, laying flat on back    Pain Relieving Factors sitting          [facility] PT Assessment - 02/26/15 0001    Assessment   Medical Diagnosis M54.16   Precautions   Precautions Other (comment)   Precaution Comments Latex allergy   Flexibility   Hamstrings Rt 70, Lt 62                   [facility] Adult PT Treatment/Exercise - 02/26/15 0001    Lumbar Exercises: Stretches   ITB Stretch 4 reps;20 seconds  with band in supine, cross body   Piriformis Stretch 2 reps;20 seconds  bilat, seated    Lumbar Exercises: Aerobic   Elliptical Level 2. 4.5 min for warm up    Lumbar Exercises: Seated   Other Seated Lumbar Exercises On Red swiss ball, seated marching x 20. Opps arm/ leg x 20; lean back with UE at 130 deg flex  for core x  8 reps   Lumbar Exercises: Supine   Straight Leg Raise 20 reps  VC to slow down, engage core    Lumbar Exercises: Prone   Other Prone Lumbar Exercises press ups x10   Lumbar Exercises: Quadruped   Straight Leg Raise 10 reps  VC to keep pelvis level, slow down, 2 sets    Modalities   Modalities Traction   Traction   Type of Traction Lumbar   Min (lbs) 30   Max (lbs) 60  Pt wt 185#   Hold Time 60 sec   Rest Time 20 sec   Time 20 min                PT Education - 02/26/15 0092  Education provided Yes   Education Details Reviewed HEP    Person(s) Educated Patient   Methods Explanation;Demonstration   Comprehension Verbalized understanding;Returned demonstration             PT Long Term Goals - 02/26/15 0950    PT LONG TERM GOAL #1   Title demonstrate and/or verbalize techniques to reduce the risk of re-injury to include information on posture and body mechanics.    Time 3   Period Weeks   Status Achieved   PT LONG TERM GOAL #2   Title be independent with advanced HEP    Time 3   Period Weeks   Status Achieved   PT LONG TERM GOAL #3   Title report pain decrease to allow him to return to jumping sports and volleyball    Time 3   Period Weeks   Status Not Met   PT LONG TERM GOAL #4   Title increase flexibility of bilat hamstrings to 65 degrees    Time 3   Status Partially Met  see latest measurements.    PT LONG TERM GOAL #5   Title improve FOTO to =/<26% limitation    Time 3   Period Weeks   Status Not Met               Plan - 02/26/15 0948    Clinical Impression Statement Pt has  reported no change in symptoms over last few weeks. Pt tolerated lumbar traction, but reported no notable change in symptoms after/during.    PT Treatment/Interventions ADLs/Self Care Home Management;Moist Heat;Therapeutic activities;Patient/family education;Traction;Therapeutic exercise;Passive range of motion;Ultrasound;Manual techniques;Cryotherapy;Electrical Stimulation   PT Next Visit Plan Spoke to supervising PT.  Will D/C and have pt return to MD for further testing.    Consulted and Agree with Plan of Care Patient        Problem List Patient Active Problem List   Diagnosis Date Noted  . Right lumbar radiculopathy 01/14/2015  . Multiple benign nevi 08/05/2014  . Preventive measure 08/05/2014  PHYSICAL THERAPY DISCHARGE SUMMARY  Visits from Start of Care: 5 Current functional level related to goals / functional outcomes: Pain continues to interfere with functional activities.   Remaining deficits: As above   Education / Equipment: HEP Plan: Patient agrees to discharge.  Patient goals were partially met. Patient is being discharged due to lack of progress.  ?????      Jeral Pinch, PT 02/26/2015, 10:07 AM  [facility] [place], [place], [address] Phone: [phone]   Fax:  [phone]

## 2015-03-03 ENCOUNTER — Ambulatory Visit (INDEPENDENT_AMBULATORY_CARE_PROVIDER_SITE_OTHER): Payer: BLUE CROSS/BLUE SHIELD

## 2015-03-03 DIAGNOSIS — M5126 Other intervertebral disc displacement, lumbar region: Secondary | ICD-10-CM

## 2015-03-03 DIAGNOSIS — Z77018 Contact with and (suspected) exposure to other hazardous metals: Secondary | ICD-10-CM

## 2015-03-03 DIAGNOSIS — M5127 Other intervertebral disc displacement, lumbosacral region: Secondary | ICD-10-CM

## 2015-03-03 DIAGNOSIS — Z1389 Encounter for screening for other disorder: Secondary | ICD-10-CM

## 2015-03-03 DIAGNOSIS — M4806 Spinal stenosis, lumbar region: Secondary | ICD-10-CM

## 2015-03-03 DIAGNOSIS — M5136 Other intervertebral disc degeneration, lumbar region: Secondary | ICD-10-CM

## 2015-03-03 DIAGNOSIS — M5124 Other intervertebral disc displacement, thoracic region: Secondary | ICD-10-CM

## 2015-03-03 DIAGNOSIS — M5416 Radiculopathy, lumbar region: Secondary | ICD-10-CM

## 2015-03-03 DIAGNOSIS — Z578 Occupational exposure to other risk factors: Secondary | ICD-10-CM

## 2015-03-03 DIAGNOSIS — M5186 Other intervertebral disc disorders, lumbar region: Secondary | ICD-10-CM

## 2015-03-12 ENCOUNTER — Ambulatory Visit (INDEPENDENT_AMBULATORY_CARE_PROVIDER_SITE_OTHER): Payer: BLUE CROSS/BLUE SHIELD | Admitting: Sports Medicine

## 2015-03-12 ENCOUNTER — Encounter: Payer: Self-pay | Admitting: Sports Medicine

## 2015-03-12 DIAGNOSIS — M5416 Radiculopathy, lumbar region: Secondary | ICD-10-CM

## 2015-03-12 NOTE — Assessment & Plan Note (Signed)
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 His L4 nerve root is certainly compressed, and I do see where his L5 nerve root contact the disc on the right side in the extraforaminal location. We are going to proceed with a right-sided L4 and L5 selective nerve root block/transforaminal epidural. Return to see me 3 weeks after the injection to evaluate response. Return also for custom orthotics at my next available slot.

## 2015-03-12 NOTE — Progress Notes (Signed)
  Subjective:    CC: MRI results  HPI: Elijah Durham returns, he has been through physical therapy, steroids, NSAIDs, persistent pain, we obtained an MRI the results of which will be dictated below. He is better able to localize his symptoms, and traces out almost exactly the right L5 and L4 nerve roots distribution in his leg and foot. Symptoms are moderate, persistent.  Past medical history, Surgical history, Family history not pertinant except as noted below, Social history, Allergies, and medications have been entered into the medical record, reviewed, and no changes needed.   Review of Systems: No fevers, chills, night sweats, weight loss, chest pain, or shortness of breath.   Objective:    General: Well Developed, well nourished, and in no acute distress.  Neuro: Alert and oriented x3, extra-ocular muscles intact, sensation grossly intact.  HEENT: Normocephalic, atraumatic, pupils equal round reactive to light, neck supple, no masses, no lymphadenopathy, thyroid nonpalpable.  Skin: Warm and dry, no rashes. Cardiac: Regular rate and rhythm, no murmurs rubs or gallops, no lower extremity edema.  Respiratory: Clear to auscultation bilaterally. Not using accessory muscles, speaking in full sentences.  MRI reviewed and shows a large disc protrusion with moderate central canal stenosis and bilateral foraminal stenosis at the L4-L5 level and a smaller disc protrusion at the L5-S1 level with potential for L5-S1 bilateral foraminal stenosis.  Impression and Recommendations:

## 2015-03-19 ENCOUNTER — Ambulatory Visit
Admission: RE | Admit: 2015-03-19 | Discharge: 2015-03-19 | Disposition: A | Discharge status: Home / Self Care | Payer: BLUE CROSS/BLUE SHIELD | Source: Ambulatory Visit | Attending: Sports Medicine | Admitting: Sports Medicine

## 2015-03-19 ENCOUNTER — Other Ambulatory Visit: Payer: Self-pay | Admitting: Sports Medicine

## 2015-03-19 DIAGNOSIS — M5416 Radiculopathy, lumbar region: Secondary | ICD-10-CM

## 2015-03-19 MED ORDER — IOHEXOL 180 MG/ML  SOLN
1.0000 mL | Freq: Once | INTRAMUSCULAR | Status: AC | PRN
  Administered 2015-03-19: 1 mL via EPIDURAL

## 2015-03-19 MED ORDER — METHYLPREDNISOLONE ACETATE 40 MG/ML INJ SUSP (RADIOLOG
120.0000 mg | Freq: Once | INTRAMUSCULAR | Status: AC
Start: 2015-03-19 — End: 2015-03-19
  Administered 2015-03-19: 120 mg via EPIDURAL

## 2015-03-19 NOTE — Discharge Instructions (Signed)

## 2015-03-25 ENCOUNTER — Encounter: Payer: Self-pay | Admitting: Sports Medicine

## 2015-03-25 ENCOUNTER — Ambulatory Visit (INDEPENDENT_AMBULATORY_CARE_PROVIDER_SITE_OTHER): Payer: BLUE CROSS/BLUE SHIELD | Admitting: Sports Medicine

## 2015-03-25 DIAGNOSIS — M5416 Radiculopathy, lumbar region: Secondary | ICD-10-CM | POA: Diagnosis not present

## 2015-03-25 NOTE — Assessment & Plan Note (Signed)
Excellent response to right L4-L5 transforaminal epidural, 50% relief. We are going to go with epidural #2 of the series, he also had an L5-S1 epidural but was most symptomatically at the L4-L5 level. Custom orthotics as above and return in 3 weeks after epidural.

## 2015-03-25 NOTE — Progress Notes (Signed)
    Patient was fitted for a : standard, cushioned, semi-rigid orthotic. The orthotic was heated and afterward the patient stood on the orthotic blank positioned on the orthotic stand. The patient was positioned in subtalar neutral position and 10 degrees of ankle dorsiflexion in a weight bearing stance. After completion of molding, a stable base was applied to the orthotic blank. The blank was ground to a stable position for weight bearing. Size: 11 Base: [employer] and safety inspector and [place]: None The patient ambulated these, and they were very comfortable.  I spent 40 minutes with this patient, greater than 50% was face-to-face time counseling regarding the below diagnosis.

## 2015-04-08 ENCOUNTER — Ambulatory Visit
Admission: RE | Admit: 2015-04-08 | Discharge: 2015-04-08 | Disposition: A | Discharge status: Home / Self Care | Payer: BLUE CROSS/BLUE SHIELD | Source: Ambulatory Visit | Attending: Sports Medicine | Admitting: Sports Medicine

## 2015-04-08 MED ORDER — IOHEXOL 180 MG/ML  SOLN
1.0000 mL | Freq: Once | INTRAMUSCULAR | Status: AC | PRN
Start: 2015-04-08 — End: 2015-04-08

## 2015-04-08 MED ORDER — METHYLPREDNISOLONE ACETATE 40 MG/ML INJ SUSP (RADIOLOG: 120.0000 mg | Freq: Once | INTRAMUSCULAR | Status: DC

## 2015-08-28 ENCOUNTER — Ambulatory Visit (INDEPENDENT_AMBULATORY_CARE_PROVIDER_SITE_OTHER): Payer: BLUE CROSS/BLUE SHIELD | Admitting: Sports Medicine

## 2015-08-28 ENCOUNTER — Encounter: Payer: Self-pay | Admitting: Sports Medicine

## 2015-08-28 VITALS — BP 142/69 | HR 85 | Ht 71.0 in | Wt 183.0 lb

## 2015-08-28 DIAGNOSIS — L723 Sebaceous cyst: Secondary | ICD-10-CM | POA: Diagnosis not present

## 2015-08-28 DIAGNOSIS — M5416 Radiculopathy, lumbar region: Secondary | ICD-10-CM | POA: Diagnosis not present

## 2015-08-28 NOTE — Assessment & Plan Note (Signed)
Left upper back, asymptomatic, no further intervention needed.

## 2015-08-28 NOTE — Assessment & Plan Note (Signed)
Continues to have a good response after a right L4-L5 transforaminal epidural, at this point he is not yet ready to consider #2 in the series. He is overall doing well.

## 2015-08-28 NOTE — Progress Notes (Signed)
  Subjective:    CC: follow-up  HPI: Lumbar radiculopathy: Continues to do well after L4-L5 right-sided transforaminal epidural, at this point he is continuing to do well and does not yet need another epidural. The injection was several months ago.  Mass on back: Left-sided upper back, this has only hurt once, typically it does extremely well, and is asymptomatic. He was told by another physician that this needed to be excised immediately.  Past medical history, Surgical history, Family history not pertinant except as noted below, Social history, Allergies, and medications have been entered into the medical record, reviewed, and no changes needed.   Review of Systems: No fevers, chills, night sweats, weight loss, chest pain, or shortness of breath.   Objective:    General: Well Developed, well nourished, and in no acute distress.  Neuro: Alert and oriented x3, extra-ocular muscles intact, sensation grossly intact.  HEENT: Normocephalic, atraumatic, pupils equal round reactive to light, neck supple, no masses, no lymphadenopathy, thyroid nonpalpable.  Skin: Warm and dry, no rashes. Cardiac: Regular rate and rhythm, no murmurs rubs or gallops, no lower extremity edema.  Respiratory: Clear to auscultation bilaterally. Not using accessory muscles, speaking in full sentences. Upper back: Left-sided, there is a small, well-defined, rounded, palpable mass, 1-2cm in diameter, nontender, and consistent with a sebaceous cyst.  Impression and Recommendations:

## 2017-11-02 ENCOUNTER — Ambulatory Visit: Payer: BLUE CROSS/BLUE SHIELD | Admitting: Sports Medicine

## 2017-11-08 ENCOUNTER — Ambulatory Visit (INDEPENDENT_AMBULATORY_CARE_PROVIDER_SITE_OTHER): Payer: PRIVATE HEALTH INSURANCE | Admitting: Sports Medicine

## 2017-11-08 DIAGNOSIS — Z0000 Encounter for general adult medical examination without abnormal findings: Secondary | ICD-10-CM | POA: Diagnosis not present

## 2017-11-08 DIAGNOSIS — K1370 Unspecified lesions of oral mucosa: Secondary | ICD-10-CM | POA: Diagnosis not present

## 2017-11-08 DIAGNOSIS — L723 Sebaceous cyst: Secondary | ICD-10-CM | POA: Diagnosis not present

## 2017-11-08 DIAGNOSIS — E785 Hyperlipidemia, unspecified: Secondary | ICD-10-CM | POA: Diagnosis not present

## 2017-11-08 NOTE — Assessment & Plan Note (Signed)
2 sebaceous cysts both of which have been infected in the past. One on the back of the neck and one on his mid back. He will schedule a follow-up, 30 minutes for excision of the sebaceous cyst on his neck. When he comes back for suture removal we can probably do the one on his mid back as well.

## 2017-11-08 NOTE — Assessment & Plan Note (Signed)
Nodular lesion on the inside of the lower lip, mucosa, nontender, present for a long time. History of oral tobacco use. I do think this needs referral to ENT for biopsy, it does not have the typical appearance of a mucocele or an aphthous ulcer.

## 2017-11-08 NOTE — Progress Notes (Addendum)
  Subjective:    CC: Annual physical  HPI:  This is a pleasant 41 year old male here for his physical, he has a couple of complaints, #1, he used to dip for a long time, over the past several months to years he is noted a nodule, nontender on the inside of his lip.  In addition he has several sebaceous cysts, and would like to have them removed.  Past medical history:  Negative.  See flowsheet/record as well for more information.  Surgical history: Negative.  See flowsheet/record as well for more information.  Family history: Negative.  See flowsheet/record as well for more information.  Social history: Negative.  See flowsheet/record as well for more information.  Allergies, and medications have been entered into the medical record, reviewed, and no changes needed.    Review of Systems: No headache, visual changes, nausea, vomiting, diarrhea, constipation, dizziness, abdominal pain, skin rash, fevers, chills, night sweats, swollen lymph nodes, weight loss, chest pain, body aches, joint swelling, muscle aches, shortness of breath, mood changes, visual or auditory hallucinations.  Objective:    General: Well Developed, well nourished, and in no acute distress.  Neuro: Alert and oriented x3, extra-ocular muscles intact, sensation grossly intact. Cranial nerves II through XII are intact, motor, sensory, and coordinative functions are all intact. HEENT: Normocephalic, atraumatic, pupils equal round reactive to light, neck supple, no masses, no lymphadenopathy, thyroid nonpalpable. Nasopharynx, external ear canals are unremarkable.  There is a dome-shaped nodule on the inside of his lower lip over the mucosa, it is nontender, nodular, but does not have the classic appearance of a mucocele or an aphthous ulcer. Skin: Warm and dry, no rashes noted.  Several sebaceous cyst, at the back of the neck and mid back.  Nontender. Cardiac: Regular rate and rhythm, no murmurs rubs or gallops.  Respiratory:  Clear to auscultation bilaterally. Not using accessory muscles, speaking in full sentences.  Abdominal: Soft, nontender, nondistended, positive bowel sounds, no masses, no organomegaly.  Musculoskeletal: Shoulder, elbow, wrist, hip, knee, ankle stable, and with full range of motion.  Impression and Recommendations:    The patient was counselled, risk factors were discussed, anticipatory guidance given.  Annual physical exam Routine physical, declined flu shot, routine blood work ordered.  Lesion of oral mucosa Nodular lesion on the inside of the lower lip, mucosa, nontender, present for a long time. History of oral tobacco use. I do think this needs referral to ENT for biopsy, it does not have the typical appearance of a mucocele or an aphthous ulcer.  Sebaceous cyst 2 sebaceous cysts both of which have been infected in the past. One on the back of the neck and one on his mid back. He will schedule a follow-up, 30 minutes for excision of the sebaceous cyst on his neck. When he comes back for suture removal we can probably do the one on his mid back as well.  Hyperlipidemia Low-cholesterol, high-fiber diet over the next 2-3 months, we will were then recheck.  ___________________________________________ Gwen Her. Dianah Field, M.D., ABFM., CAQSM. Primary Care and [employer]

## 2017-11-08 NOTE — Assessment & Plan Note (Signed)
Routine physical, declined flu shot, routine blood work ordered.

## 2017-11-09 DIAGNOSIS — E785 Hyperlipidemia, unspecified: Secondary | ICD-10-CM | POA: Insufficient documentation

## 2017-11-09 LAB — COMPREHENSIVE METABOLIC PANEL
AG Ratio: 1.7 (calc) (ref 1.0–2.5)
ALT: 50 U/L — ABNORMAL HIGH (ref 9–46)
AST: 32 U/L (ref 10–40)
Albumin: 4.8 g/dL (ref 3.6–5.1)
Alkaline phosphatase (APISO): 66 U/L (ref 40–115)
Chloride: 100 mmol/L (ref 98–110)
Creat: 0.92 mg/dL (ref 0.60–1.35)
Globulin: 2.8 g/dL (calc) (ref 1.9–3.7)
Glucose, Bld: 103 mg/dL — ABNORMAL HIGH (ref 65–99)
Potassium: 4.5 mmol/L (ref 3.5–5.3)
Sodium: 138 mmol/L (ref 135–146)
Total Bilirubin: 0.6 mg/dL (ref 0.2–1.2)

## 2017-11-09 LAB — VITAMIN D 25 HYDROXY (VIT D DEFICIENCY, FRACTURES): Vit D, 25-Hydroxy: 28 ng/mL — ABNORMAL LOW (ref 30–100)

## 2017-11-09 LAB — COMPREHENSIVE METABOLIC PANEL WITH GFR
BUN: 18 mg/dL (ref 7–25)
CO2: 32 mmol/L (ref 20–32)
Calcium: 9.5 mg/dL (ref 8.6–10.3)
Total Protein: 7.6 g/dL (ref 6.1–8.1)

## 2017-11-09 LAB — TSH: TSH: 1.67 m[IU]/L (ref 0.40–4.50)

## 2017-11-09 LAB — CBC
HCT: 46.4 % (ref 38.5–50.0)
Hemoglobin: 15.8 g/dL (ref 13.2–17.1)
MCH: 30.2 pg (ref 27.0–33.0)
MCHC: 34.1 g/dL (ref 32.0–36.0)
MCV: 88.7 fL (ref 80.0–100.0)
MPV: 11.2 fL (ref 7.5–12.5)
Platelets: 218 Thousand/uL (ref 140–400)
RBC: 5.23 10*6/uL (ref 4.20–5.80)
RDW: 12.7 % (ref 11.0–15.0)
WBC: 5 10*3/uL (ref 3.8–10.8)

## 2017-11-09 LAB — LIPID PANEL W/REFLEX DIRECT LDL
Cholesterol: 236 mg/dL — ABNORMAL HIGH (ref <200)
HDL: 75 mg/dL (ref >40)
LDL Cholesterol (Calc): 143 mg/dL — ABNORMAL HIGH
Non-HDL Cholesterol (Calc): 161 mg/dL — ABNORMAL HIGH (ref <130)
Total CHOL/HDL Ratio: 3.1 (calc) (ref <5.0)
Triglycerides: 76 mg/dL (ref <150)

## 2017-11-09 LAB — HEMOGLOBIN A1C
Hgb A1c MFr Bld: 5.4 % of total Hgb (ref <5.7)
Mean Plasma Glucose: 108 (calc)
eAG (mmol/L): 6 (calc)

## 2017-11-09 LAB — HIV ANTIBODY (ROUTINE TESTING W REFLEX): HIV 1&2 Ab, 4th Generation: NONREACTIVE

## 2017-11-09 MED ORDER — VITAMIN D (ERGOCALCIFEROL) 1.25 MG (50000 UNIT) PO CAPS: 50000.0000 [IU] | ORAL_CAPSULE | ORAL | 0 refills | Status: DC

## 2017-11-09 NOTE — Addendum Note (Signed)
Addended by: Silverio Decamp on: 11/09/2017 08:49 AM   Modules accepted: Orders

## 2017-11-09 NOTE — Assessment & Plan Note (Signed)
Low-cholesterol, high-fiber diet over the next 2-3 months, we will were then recheck.

## 2017-11-22 ENCOUNTER — Ambulatory Visit: Payer: PRIVATE HEALTH INSURANCE | Admitting: Sports Medicine

## 2017-12-31 ENCOUNTER — Other Ambulatory Visit: Payer: Self-pay | Admitting: Sports Medicine

## 2017-12-31 DIAGNOSIS — E785 Hyperlipidemia, unspecified: Secondary | ICD-10-CM

## 2018-10-25 ENCOUNTER — Encounter: Payer: Self-pay | Admitting: Sports Medicine

## 2018-10-25 ENCOUNTER — Ambulatory Visit (INDEPENDENT_AMBULATORY_CARE_PROVIDER_SITE_OTHER): Payer: No Typology Code available for payment source | Admitting: Sports Medicine

## 2018-10-25 ENCOUNTER — Ambulatory Visit (INDEPENDENT_AMBULATORY_CARE_PROVIDER_SITE_OTHER): Payer: No Typology Code available for payment source

## 2018-10-25 DIAGNOSIS — E785 Hyperlipidemia, unspecified: Secondary | ICD-10-CM

## 2018-10-25 DIAGNOSIS — G8929 Other chronic pain: Secondary | ICD-10-CM

## 2018-10-25 DIAGNOSIS — R531 Weakness: Secondary | ICD-10-CM | POA: Insufficient documentation

## 2018-10-25 DIAGNOSIS — M7712 Lateral epicondylitis, left elbow: Secondary | ICD-10-CM

## 2018-10-25 DIAGNOSIS — M25522 Pain in left elbow: Secondary | ICD-10-CM | POA: Diagnosis not present

## 2018-10-25 DIAGNOSIS — M25512 Pain in left shoulder: Secondary | ICD-10-CM

## 2018-10-25 DIAGNOSIS — L723 Sebaceous cyst: Secondary | ICD-10-CM

## 2018-10-25 DIAGNOSIS — M7711 Lateral epicondylitis, right elbow: Secondary | ICD-10-CM | POA: Insufficient documentation

## 2018-10-25 DIAGNOSIS — M25511 Pain in right shoulder: Secondary | ICD-10-CM | POA: Diagnosis not present

## 2018-10-25 DIAGNOSIS — Z3009 Encounter for other general counseling and advice on contraception: Secondary | ICD-10-CM | POA: Diagnosis not present

## 2018-10-25 MED ORDER — MELOXICAM 15 MG PO TABS
ORAL_TABLET | ORAL | 3 refills | Status: DC
Start: 2018-10-25 — End: 2020-06-04

## 2018-10-25 NOTE — Progress Notes (Signed)
Subjective:    CC: Shoulder pain, elbow pain  HPI: For the past few months this pleasant 42 year old male has noted progressive weakness to lifting in both shoulders as well as pain on the lateral aspect of his left elbow.  Moderate, persistent without radiation.  Shoulder pain is worse over the deltoid and worse with abduction.  He denies any paresthesias into the hands or fingertips and denies any neck pain.  Family planning: Would like referral for vasectomy.  Sebaceous cyst: On the back, not inflamed, would like this removed.  I reviewed the past medical history, family history, social history, surgical history, and allergies today and no changes were needed.  Please see the problem list section below in epic for further details.  Past Medical History: Past Medical History:  Diagnosis Date  . Low back pain with sciatica 2012   Dr Micheline Chapman   Past Surgical History: Past Surgical History:  Procedure Laterality Date  . epidural steroid  2012   Dr Micheline Chapman  . WISDOM TOOTH EXTRACTION     Social History: Social History   Socioeconomic History  . Marital status: Single    Spouse name: Not on file  . Number of children: Not on file  . Years of education: Not on file  . Highest education level: Not on file  Occupational History  . Not on file  Social Needs  . Financial resource strain: Not on file  . Food insecurity:    Worry: Not on file    Inability: Not on file  . Transportation needs:    Medical: Not on file    Non-medical: Not on file  Tobacco Use  . Smoking status: Never Smoker  . Smokeless tobacco: Never Used  Substance and Sexual Activity  . Alcohol use: Yes    Comment:  occasionally  . Drug use: No  . Sexual activity: Yes  Lifestyle  . Physical activity:    Days per week: Not on file    Minutes per session: Not on file  . Stress: Not on file  Relationships  . Social connections:    Talks on phone: Not on file    Gets together: Not on file    Attends  religious service: Not on file    Active member of club or organization: Not on file    Attends meetings of clubs or organizations: Not on file    Relationship status: Not on file  Other Topics Concern  . Not on file  Social History Narrative  . Not on file   Family History: Family History  Problem Relation Age of Onset  . Cancer Mother        breast  . Hyperlipidemia Mother   . Hypertension Mother   . Heart disease Father   . Dementia Maternal Grandmother   . COPD Paternal Grandfather   . Heart failure Paternal Grandfather   . Cirrhosis Paternal Grandmother    Allergies: Allergies  Allergen Reactions  . Aloe Vera Rash    rash  . Amoxapine Rash  . Amoxicillin Rash    rash  . Latex Rash  . Neomycin-Bacitracin Zn-Polymyx Rash  . Penicillins Rash   Medications: See med rec.  Review of Systems: No fevers, chills, night sweats, weight loss, chest pain, or shortness of breath.   Objective:    General: Well Developed, well nourished, and in no acute distress.  Neuro: Alert and oriented x3, extra-ocular muscles intact, sensation grossly intact.  HEENT: Normocephalic, atraumatic, pupils equal round reactive to  light, neck supple, no masses, no lymphadenopathy, thyroid nonpalpable.  Skin: Warm and dry, no rashes.  There is a 2 to 3 cm sebaceous cyst on his mid upper back.  No overlying erythema. Cardiac: Regular rate and rhythm, no murmurs rubs or gallops, no lower extremity edema.  Respiratory: Clear to auscultation bilaterally. Not using accessory muscles, speaking in full sentences. Left elbow: Unremarkable to inspection. Range of motion full pronation, supination, flexion, extension. Strength is full to all of the above directions Stable to varus, valgus stress. Negative moving valgus stress test. Tender to palpation of the common extensor tendon origin Ulnar nerve does not sublux. Negative cubital tunnel Tinel's. Bilateral shoulders: Inspection reveals no  abnormalities, atrophy or asymmetry. Palpation is normal with no tenderness over AC joint or bicipital groove. ROM is full in all planes. Rotator cuff strength normal throughout. Positive Neer's, Hawkins signs on the right, negative on the left. Positive speeds and Yergason test on the left, negative on the right. No labral pathology noted with negative Obrien's, negative crank, negative clunk, and good stability. Normal scapular function observed. No painful arc and no drop arm sign. No apprehension sign  Impression and Recommendations:    Sebaceous cyst Return for my next 30-minute visit for excision, upper back.  Left tennis elbow Patient has a tennis elbow brace but was not wearing it in the right position. He will wear this 18 hours a day, rehab exercises given, x-rays, meloxicam.  Next line return in 6 weeks, injection if no better.  Family planning Referral to urology for vasectomy  Bilateral shoulder pain Left-sided bicipital tendinitis, right sided rotator cuff and labrum. Rehab exercises given, bilateral x-rays, meloxicam.  Weakness Checking appropriate labs  Hyperlipidemia Rechecking lipids  I spent 40 minutes with this patient, greater than 50% was face-to-face time counseling regarding the above diagnoses, specifically discussing the treatment plan and pathophysiology of the multiple above discussed diagnoses. ___________________________________________ Gwen Her. Dianah Field, M.D., ABFM., CAQSM. Primary Care and Sports Medicine [facility]  Adjunct Professor of [place] of [facility] of Medicine

## 2018-10-25 NOTE — Assessment & Plan Note (Signed)
Return for my next 30-minute visit for excision, upper back.

## 2018-10-25 NOTE — Assessment & Plan Note (Signed)
Left-sided bicipital tendinitis, right sided rotator cuff and labrum. Rehab exercises given, bilateral x-rays, meloxicam.

## 2018-10-25 NOTE — Assessment & Plan Note (Signed)
Patient has a tennis elbow brace but was not wearing it in the right position. He will wear this 18 hours a day, rehab exercises given, x-rays, meloxicam.  Next line return in 6 weeks, injection if no better.

## 2018-10-25 NOTE — Assessment & Plan Note (Signed)
Referral to urology for vasectomy

## 2018-10-25 NOTE — Assessment & Plan Note (Signed)
Rechecking lipids

## 2018-10-25 NOTE — Assessment & Plan Note (Signed)
Checking appropriate labs

## 2018-10-30 LAB — COMPREHENSIVE METABOLIC PANEL
AG Ratio: 2.1 (calc) (ref 1.0–2.5)
Albumin: 4.8 g/dL (ref 3.6–5.1)
CO2: 30 mmol/L (ref 20–32)
Chloride: 101 mmol/L (ref 98–110)
Creat: 0.95 mg/dL (ref 0.60–1.35)
Globulin: 2.3 g/dL (calc) (ref 1.9–3.7)
Glucose, Bld: 100 mg/dL (ref 65–139)
Potassium: 4.6 mmol/L (ref 3.5–5.3)
Sodium: 139 mmol/L (ref 135–146)
Total Protein: 7.1 g/dL (ref 6.1–8.1)

## 2018-10-30 LAB — HEMOGLOBIN A1C
Hgb A1c MFr Bld: 5.5 %{Hb} (ref <5.7)
Mean Plasma Glucose: 111 (calc)
eAG (mmol/L): 6.2 (calc)

## 2018-10-30 LAB — LIPID PANEL W/REFLEX DIRECT LDL
Cholesterol: 209 mg/dL — ABNORMAL HIGH (ref <200)
HDL: 73 mg/dL (ref >40)
LDL Cholesterol (Calc): 122 mg/dL (calc) — ABNORMAL HIGH
Non-HDL Cholesterol (Calc): 136 mg/dL — ABNORMAL HIGH (ref <130)
Total CHOL/HDL Ratio: 2.9 (calc) (ref <5.0)
Triglycerides: 52 mg/dL (ref <150)

## 2018-10-30 LAB — COMPREHENSIVE METABOLIC PANEL WITH GFR
ALT: 65 U/L — ABNORMAL HIGH (ref 9–46)
AST: 35 U/L (ref 10–40)
Alkaline phosphatase (APISO): 76 U/L (ref 40–115)
BUN: 18 mg/dL (ref 7–25)
Calcium: 9.6 mg/dL (ref 8.6–10.3)
Total Bilirubin: 0.6 mg/dL (ref 0.2–1.2)

## 2018-10-30 LAB — CBC
HCT: 45.9 % (ref 38.5–50.0)
Hemoglobin: 15.5 g/dL (ref 13.2–17.1)
MCH: 30.8 pg (ref 27.0–33.0)
MCHC: 33.8 g/dL (ref 32.0–36.0)
MCV: 91.1 fL (ref 80.0–100.0)
MPV: 11.2 fL (ref 7.5–12.5)
Platelets: 186 Thousand/uL (ref 140–400)
RBC: 5.04 10*6/uL (ref 4.20–5.80)
RDW: 12.9 % (ref 11.0–15.0)
WBC: 5.1 10*3/uL (ref 3.8–10.8)

## 2018-10-30 LAB — CK: Total CK: 216 U/L — ABNORMAL HIGH (ref 44–196)

## 2018-10-30 LAB — TESTOSTERONE, FREE & TOTAL
Free Testosterone: 60.7 pg/mL (ref 35.0–155.0)
Testosterone, Total, LC-MS-MS: 357 ng/dL (ref 250–1100)

## 2018-10-30 LAB — TSH: TSH: 1.57 mIU/L (ref 0.40–4.50)

## 2018-10-31 ENCOUNTER — Ambulatory Visit (INDEPENDENT_AMBULATORY_CARE_PROVIDER_SITE_OTHER): Payer: No Typology Code available for payment source | Admitting: Sports Medicine

## 2018-10-31 ENCOUNTER — Encounter: Payer: Self-pay | Admitting: Sports Medicine

## 2018-10-31 DIAGNOSIS — L723 Sebaceous cyst: Secondary | ICD-10-CM

## 2018-10-31 MED ORDER — HYDROCODONE-ACETAMINOPHEN 5-325 MG PO TABS: 1.0000 | ORAL_TABLET | Freq: Three times a day (TID) | ORAL | 0 refills | Status: DC | PRN

## 2018-10-31 MED ORDER — DOXYCYCLINE HYCLATE 100 MG PO TABS: 100.0000 mg | ORAL_TABLET | Freq: Two times a day (BID) | ORAL | 0 refills | Status: AC

## 2018-10-31 NOTE — Assessment & Plan Note (Signed)
Surgical excision as above, return in 2 weeks for suture removal, 1 week of doxycycline. Hydrocodone for postoperative pain.

## 2018-10-31 NOTE — Progress Notes (Signed)
   Procedure:  Excision of 3 cm sebaceous cyst left upper back Risks, benefits, and alternatives explained and consent obtained. Time out conducted. Surface prepped with alcohol. 10cc lidocaine with epinephine infiltrated in a field block. Adequate anesthesia ensured. Area prepped and draped in a sterile fashion. Excision performed with: I made an elliptical incision on the skin overlying sebaceous cyst, then using both sharp and blunt dissection I proceeded around the sebaceous cyst removing it as a hole, there is a significant amount of scar tissue to get through.  I then closed the incision with #5-0 Prolene simple interrupted sutures. Hemostasis achieved. Pt stable.

## 2018-11-14 ENCOUNTER — Ambulatory Visit (INDEPENDENT_AMBULATORY_CARE_PROVIDER_SITE_OTHER): Payer: No Typology Code available for payment source | Admitting: Sports Medicine

## 2018-11-14 ENCOUNTER — Encounter: Payer: Self-pay | Admitting: Sports Medicine

## 2018-11-14 DIAGNOSIS — R030 Elevated blood-pressure reading, without diagnosis of hypertension: Secondary | ICD-10-CM

## 2018-11-14 DIAGNOSIS — L723 Sebaceous cyst: Secondary | ICD-10-CM

## 2018-11-14 NOTE — Assessment & Plan Note (Signed)
Benzoin and Steri-Strips applied after suture removal, return as needed.

## 2018-11-14 NOTE — Progress Notes (Signed)
  Subjective: Elijah Durham returns, he is 2 weeks post sebaceous cyst removal from his back, doing well.  Objective: General: Well-developed, well-nourished, and in no acute distress. Incision: Clean, dry, intact, sutures removed, benzoin, Steri-Strips applied.  Assessment/plan:   Sebaceous cyst Benzoin and Steri-Strips applied after suture removal, return as needed.  Elevated blood pressure reading Patient will check his blood pressures at home over the next couple of weeks and send me messages on my chart. ___________________________________________ Gwen Her. Dianah Field, M.D., ABFM., CAQSM. Primary Care and [place] Instructor of [place] of [facility] of Medicine

## 2018-11-14 NOTE — Assessment & Plan Note (Signed)
Patient will check his blood pressures at home over the next couple of weeks and send me messages on my chart.

## 2018-12-04 ENCOUNTER — Ambulatory Visit: Payer: No Typology Code available for payment source | Admitting: Sports Medicine

## 2019-06-28 DIAGNOSIS — Z302 Encounter for sterilization: Secondary | ICD-10-CM | POA: Diagnosis not present

## 2020-01-06 DIAGNOSIS — Z20828 Contact with and (suspected) exposure to other viral communicable diseases: Secondary | ICD-10-CM | POA: Diagnosis not present

## 2020-02-01 DIAGNOSIS — Z20828 Contact with and (suspected) exposure to other viral communicable diseases: Secondary | ICD-10-CM | POA: Diagnosis not present

## 2020-06-04 ENCOUNTER — Encounter: Payer: Self-pay | Admitting: Sports Medicine

## 2020-06-04 ENCOUNTER — Ambulatory Visit (INDEPENDENT_AMBULATORY_CARE_PROVIDER_SITE_OTHER): Payer: BC Managed Care – PPO | Admitting: Sports Medicine

## 2020-06-04 ENCOUNTER — Other Ambulatory Visit: Payer: Self-pay

## 2020-06-04 VITALS — BP 129/84 | HR 79 | Ht 71.0 in | Wt 185.0 lb

## 2020-06-04 DIAGNOSIS — Z0000 Encounter for general adult medical examination without abnormal findings: Secondary | ICD-10-CM

## 2020-06-04 DIAGNOSIS — E785 Hyperlipidemia, unspecified: Secondary | ICD-10-CM

## 2020-06-04 DIAGNOSIS — E559 Vitamin D deficiency, unspecified: Secondary | ICD-10-CM

## 2020-06-04 DIAGNOSIS — Z8616 Personal history of COVID-19: Secondary | ICD-10-CM

## 2020-06-04 NOTE — Progress Notes (Addendum)
Subjective:    CC: Annual Physical Exam  HPI:  This patient is here for their annual physical  I reviewed the past medical history, family history, social history, surgical history, and allergies today and no changes were needed.  Please see the problem list section below in epic for further details.  Past Medical History: Past Medical History:  Diagnosis Date  . Low back pain with sciatica 2012   Dr Micheline Chapman   Past Surgical History: Past Surgical History:  Procedure Laterality Date  . epidural steroid  2012   Dr Micheline Chapman  . WISDOM TOOTH EXTRACTION     Social History: Social History   Socioeconomic History  . Marital status: Single    Spouse name: Not on file  . Number of children: Not on file  . Years of education: Not on file  . Highest education level: Not on file  Occupational History  . Not on file  Tobacco Use  . Smoking status: Never Smoker  . Smokeless tobacco: Never Used  Substance and Sexual Activity  . Alcohol use: Yes    Comment:  occasionally  . Drug use: No  . Sexual activity: Yes  Other Topics Concern  . Not on file  Social History Narrative  . Not on file   Social Determinants of Health   Financial Resource Strain:   . Difficulty of Paying Living Expenses:   Food Insecurity:   . Worried About Charity fundraiser in the Last Year:   . Arboriculturist in the Last Year:   Transportation Needs:   . Film/video editor (Medical):   Marland Kitchen Lack of Transportation (Non-Medical):   Physical Activity:   . Days of Exercise per Week:   . Minutes of Exercise per Session:   Stress:   . Feeling of Stress :   Social Connections:   . Frequency of Communication with Friends and Family:   . Frequency of Social Gatherings with Friends and Family:   . Attends Religious Services:   . Active Member of Clubs or Organizations:   . Attends Archivist Meetings:   Marland Kitchen Marital Status:    Family History: Family History  Problem Relation Age of Onset  .  Cancer Mother        breast  . Hyperlipidemia Mother   . Hypertension Mother   . Heart disease Father   . Dementia Maternal Grandmother   . COPD Paternal Grandfather   . Heart failure Paternal Grandfather   . Cirrhosis Paternal Grandmother    Allergies: Allergies  Allergen Reactions  . Aloe Vera Rash    rash  . Amoxapine Rash  . Amoxicillin Rash    rash  . Latex Rash  . Neomycin-Bacitracin Zn-Polymyx Rash  . Penicillins Rash   Medications: See med rec.  Review of Systems: No headache, visual changes, nausea, vomiting, diarrhea, constipation, dizziness, abdominal pain, skin rash, fevers, chills, night sweats, swollen lymph nodes, weight loss, chest pain, body aches, joint swelling, muscle aches, shortness of breath, mood changes, visual or auditory hallucinations.  Objective:    General: Well Developed, well nourished, and in no acute distress.  Neuro: Alert and oriented x3, extra-ocular muscles intact, sensation grossly intact. Cranial nerves II through XII are intact, motor, sensory, and coordinative functions are all intact. HEENT: Normocephalic, atraumatic, pupils equal round reactive to light, neck supple, no masses, no lymphadenopathy, thyroid nonpalpable. Oropharynx, nasopharynx, external ear canals are unremarkable. Skin: Warm and dry, no rashes noted.  Cardiac: Regular rate  and rhythm, no murmurs rubs or gallops.  Respiratory: Clear to auscultation bilaterally. Not using accessory muscles, speaking in full sentences.  Abdominal: Soft, nontender, nondistended, positive bowel sounds, no masses, no organomegaly.  Musculoskeletal: Shoulder, elbow, wrist, hip, knee, ankle stable, and with full range of motion.  Impression and Recommendations:    The patient was counselled, risk factors were discussed, anticipatory guidance given.  Annual physical exam Routine physical as above, routine labs ordered, return to see me in 1 year. He is healthy, no complaints. He and his  wife are getting new jobs, while this is a bit stressful it does ultimately mean a more stable income.  History of COVID-19 Recovered, still has some abnormalities in taste.  Hyperlipidemia Merry Proud plans dietary changes first, we can recheck fasting in 3-6 months.   ___________________________________________ Gwen Her. Dianah Field, M.D., ABFM., CAQSM. Primary Care and Sports Medicine [facility]  Adjunct Professor of [place] of [facility] of Medicine

## 2020-06-04 NOTE — Assessment & Plan Note (Signed)
Recovered, still has some abnormalities in taste.

## 2020-06-04 NOTE — Patient Instructions (Signed)
DASH Eating Plan DASH stands for "Dietary Approaches to Stop Hypertension." The DASH eating plan is a healthy eating plan that has been shown to reduce high blood pressure (hypertension). It may also reduce your risk for type 2 diabetes, heart disease, and stroke. The DASH eating plan may also help with weight loss. What are tips for following this plan?  General guidelines  Avoid eating more than 2,300 mg (milligrams) of salt (sodium) a day. If you have hypertension, you may need to reduce your sodium intake to 1,500 mg a day.  Limit alcohol intake to no more than 1 drink a day for nonpregnant women and 2 drinks a day for men. One drink equals 12 oz of beer, 5 oz of wine, or 1 oz of hard liquor.  Work with your health care provider to maintain a healthy body weight or to lose weight. Ask what an ideal weight is for you.  Get at least 30 minutes of exercise that causes your heart to beat faster (aerobic exercise) most days of the week. Activities may include walking, swimming, or biking.  Work with your health care provider or diet and nutrition specialist (dietitian) to adjust your eating plan to your individual calorie needs. Reading food labels   Check food labels for the amount of sodium per serving. Choose foods with less than 5 percent of the Daily Value of sodium. Generally, foods with less than 300 mg of sodium per serving fit into this eating plan.  To find whole grains, look for the word "whole" as the first word in the ingredient list. Shopping  Buy products labeled as "low-sodium" or "no salt added."  Buy fresh foods. Avoid canned foods and premade or frozen meals. Cooking  Avoid adding salt when cooking. Use salt-free seasonings or herbs instead of table salt or sea salt. Check with your health care provider or pharmacist before using salt substitutes.  Do not fry foods. Cook foods using healthy methods such as baking, boiling, grilling, and broiling instead.  Cook with  heart-healthy oils, such as olive, canola, soybean, or sunflower oil. Meal planning  Eat a balanced diet that includes: ? 5 or more servings of fruits and vegetables each day. At each meal, try to fill half of your plate with fruits and vegetables. ? Up to 6-8 servings of whole grains each day. ? Less than 6 oz of lean meat, poultry, or fish each day. A 3-oz serving of meat is about the same size as a deck of cards. One egg equals 1 oz. ? 2 servings of low-fat dairy each day. ? A serving of nuts, seeds, or beans 5 times each week. ? Heart-healthy fats. Healthy fats called Omega-3 fatty acids are found in foods such as flaxseeds and coldwater fish, like sardines, salmon, and mackerel.  Limit how much you eat of the following: ? Canned or prepackaged foods. ? Food that is high in trans fat, such as fried foods. ? Food that is high in saturated fat, such as fatty meat. ? Sweets, desserts, sugary drinks, and other foods with added sugar. ? Full-fat dairy products.  Do not salt foods before eating.  Try to eat at least 2 vegetarian meals each week.  Eat more home-cooked food and less restaurant, buffet, and fast food.  When eating at a restaurant, ask that your food be prepared with less salt or no salt, if possible. What foods are recommended? The items listed may not be a complete list. Talk with your dietitian about   what dietary choices are best for you. Grains Whole-grain or whole-wheat bread. Whole-grain or whole-wheat pasta. Brown rice. Oatmeal. Quinoa. Bulgur. Whole-grain and low-sodium cereals. Pita bread. Low-fat, low-sodium crackers. Whole-wheat flour tortillas. Vegetables Fresh or frozen vegetables (raw, steamed, roasted, or grilled). Low-sodium or reduced-sodium tomato and vegetable juice. Low-sodium or reduced-sodium tomato sauce and tomato paste. Low-sodium or reduced-sodium canned vegetables. Fruits All fresh, dried, or frozen fruit. Canned fruit in natural juice (without  added sugar). Meat and other protein foods Skinless chicken or turkey. Ground chicken or turkey. Pork with fat trimmed off. Fish and seafood. Egg whites. Dried beans, peas, or lentils. Unsalted nuts, nut butters, and seeds. Unsalted canned beans. Lean cuts of beef with fat trimmed off. Low-sodium, lean deli meat. Dairy Low-fat (1%) or fat-free (skim) milk. Fat-free, low-fat, or reduced-fat cheeses. Nonfat, low-sodium ricotta or cottage cheese. Low-fat or nonfat yogurt. Low-fat, low-sodium cheese. Fats and oils Soft margarine without trans fats. Vegetable oil. Low-fat, reduced-fat, or light mayonnaise and salad dressings (reduced-sodium). Canola, safflower, olive, soybean, and sunflower oils. Avocado. Seasoning and other foods Herbs. Spices. Seasoning mixes without salt. Unsalted popcorn and pretzels. Fat-free sweets. What foods are not recommended? The items listed may not be a complete list. Talk with your dietitian about what dietary choices are best for you. Grains Baked goods made with fat, such as croissants, muffins, or some breads. Dry pasta or rice meal packs. Vegetables Creamed or fried vegetables. Vegetables in a cheese sauce. Regular canned vegetables (not low-sodium or reduced-sodium). Regular canned tomato sauce and paste (not low-sodium or reduced-sodium). Regular tomato and vegetable juice (not low-sodium or reduced-sodium). Pickles. Olives. Fruits Canned fruit in a light or heavy syrup. Fried fruit. Fruit in cream or butter sauce. Meat and other protein foods Fatty cuts of meat. Ribs. Fried meat. Bacon. Sausage. Bologna and other processed lunch meats. Salami. Fatback. Hotdogs. Bratwurst. Salted nuts and seeds. Canned beans with added salt. Canned or smoked fish. Whole eggs or egg yolks. Chicken or turkey with skin. Dairy Whole or 2% milk, cream, and half-and-half. Whole or full-fat cream cheese. Whole-fat or sweetened yogurt. Full-fat cheese. Nondairy creamers. Whipped toppings.  Processed cheese and cheese spreads. Fats and oils Butter. Stick margarine. Lard. Shortening. Ghee. Bacon fat. Tropical oils, such as coconut, palm kernel, or palm oil. Seasoning and other foods Salted popcorn and pretzels. Onion salt, garlic salt, seasoned salt, table salt, and sea salt. Worcestershire sauce. Tartar sauce. Barbecue sauce. Teriyaki sauce. Soy sauce, including reduced-sodium. Steak sauce. Canned and packaged gravies. Fish sauce. Oyster sauce. Cocktail sauce. Horseradish that you find on the shelf. Ketchup. Mustard. Meat flavorings and tenderizers. Bouillon cubes. Hot sauce and Tabasco sauce. Premade or packaged marinades. Premade or packaged taco seasonings. Relishes. Regular salad dressings. Where to find more information:  National Heart, Lung, and Blood Institute: www.nhlbi.nih.gov  American Heart Association: www.heart.org Summary  The DASH eating plan is a healthy eating plan that has been shown to reduce high blood pressure (hypertension). It may also reduce your risk for type 2 diabetes, heart disease, and stroke.  With the DASH eating plan, you should limit salt (sodium) intake to 2,300 mg a day. If you have hypertension, you may need to reduce your sodium intake to 1,500 mg a day.  When on the DASH eating plan, aim to eat more fresh fruits and vegetables, whole grains, lean proteins, low-fat dairy, and heart-healthy fats.  Work with your health care provider or diet and nutrition specialist (dietitian) to adjust your eating plan to your   individual calorie needs. This information is not intended to replace advice given to you by your health care provider. Make sure you discuss any questions you have with your health care provider. Document Revised: 11/25/2017 Document Reviewed: 12/06/2016 Elsevier Patient Education  2020 Elsevier Inc.  

## 2020-06-04 NOTE — Assessment & Plan Note (Signed)
Routine physical as above, routine labs ordered, return to see me in 1 year. He is healthy, no complaints. He and his wife are getting new jobs, while this is a bit stressful it does ultimately mean a more stable income.

## 2020-06-05 LAB — CBC
HCT: 49.4 % (ref 38.5–50.0)
Hemoglobin: 16.3 g/dL (ref 13.2–17.1)
MCH: 30.5 pg (ref 27.0–33.0)
MCHC: 33 g/dL (ref 32.0–36.0)
MCV: 92.3 fL (ref 80.0–100.0)
MPV: 11.5 fL (ref 7.5–12.5)
Platelets: 219 10*3/uL (ref 140–400)
RBC: 5.35 10*6/uL (ref 4.20–5.80)
RDW: 13.1 % (ref 11.0–15.0)
WBC: 4.9 10*3/uL (ref 3.8–10.8)

## 2020-06-05 LAB — VITAMIN D 25 HYDROXY (VIT D DEFICIENCY, FRACTURES): Vit D, 25-Hydroxy: 21 ng/mL — ABNORMAL LOW (ref 30–100)

## 2020-06-05 LAB — COMPLETE METABOLIC PANEL WITH GFR
AG Ratio: 1.8 (calc) (ref 1.0–2.5)
ALT: 57 U/L — ABNORMAL HIGH (ref 9–46)
AST: 30 U/L (ref 10–40)
Albumin: 4.9 g/dL (ref 3.6–5.1)
Alkaline phosphatase (APISO): 72 U/L (ref 36–130)
BUN: 15 mg/dL (ref 7–25)
CO2: 28 mmol/L (ref 20–32)
Calcium: 9.9 mg/dL (ref 8.6–10.3)
Chloride: 98 mmol/L (ref 98–110)
Creat: 0.93 mg/dL (ref 0.60–1.35)
GFR, Est African American: 114 mL/min/{1.73_m2} (ref >=60)
GFR, Est Non African American: 99 mL/min/{1.73_m2} (ref >=60)
Globulin: 2.8 g/dL (calc) (ref 1.9–3.7)
Glucose, Bld: 101 mg/dL — ABNORMAL HIGH (ref 65–99)
Potassium: 4.3 mmol/L (ref 3.5–5.3)
Sodium: 138 mmol/L (ref 135–146)
Total Bilirubin: 1 mg/dL (ref 0.2–1.2)
Total Protein: 7.7 g/dL (ref 6.1–8.1)

## 2020-06-05 LAB — LIPID PANEL W/REFLEX DIRECT LDL
Cholesterol: 220 mg/dL — ABNORMAL HIGH (ref <200)
HDL: 73 mg/dL (ref >=40)
LDL Cholesterol (Calc): 128 mg/dL (calc) — ABNORMAL HIGH
Non-HDL Cholesterol (Calc): 147 mg/dL (calc) — ABNORMAL HIGH (ref <130)
Total CHOL/HDL Ratio: 3 (calc) (ref <5.0)
Triglycerides: 86 mg/dL (ref <150)

## 2020-06-05 LAB — TSH: TSH: 1.55 mIU/L (ref 0.40–4.50)

## 2020-06-05 MED ORDER — VITAMIN D (ERGOCALCIFEROL) 1.25 MG (50000 UNIT) PO CAPS: 50000.0000 [IU] | ORAL_CAPSULE | ORAL | 0 refills | Status: DC

## 2020-06-05 NOTE — Assessment & Plan Note (Signed)
Merry Proud plans dietary changes first, we can recheck fasting in 3-6 months.

## 2020-06-05 NOTE — Addendum Note (Signed)
Addended by: Silverio Decamp on: 06/05/2020 11:27 AM   Modules accepted: Orders

## 2020-09-16 ENCOUNTER — Ambulatory Visit (INDEPENDENT_AMBULATORY_CARE_PROVIDER_SITE_OTHER): Payer: BC Managed Care – PPO

## 2020-09-16 ENCOUNTER — Ambulatory Visit (INDEPENDENT_AMBULATORY_CARE_PROVIDER_SITE_OTHER): Payer: BC Managed Care – PPO | Admitting: Sports Medicine

## 2020-09-16 DIAGNOSIS — M7711 Lateral epicondylitis, right elbow: Secondary | ICD-10-CM

## 2020-09-16 NOTE — Progress Notes (Signed)
° ° °  Procedures performed today:    None.  Independent interpretation of notes and tests performed by another provider:   None.  Brief History, Exam, Impression, and Recommendations:    Elijah Durham is a pleasant 44yo male who presents today with new onset right elbow pain that has been ongoing for about 2 weeks. He has pain at the lateral aspect of his elbow. He is right handed.  He has pain with extension of his wrist and to palpation of his lateral epicondyle. This is most likely tennis elbow. We injected the joint today and hav given him at home exercises to do. He is going to resume light duty for the next week with mostly his left hand. He will see [place] back in 4-6 weeks for reevaluation.   Marcelino Duster, MS3   ___________________________________________ Gwen Her. Dianah Field, M.D., ABFM., CAQSM. Primary Care and [place] Instructor of [place] of [facility] of [employer]

## 2020-09-16 NOTE — Assessment & Plan Note (Signed)
This is a very pleasant 44 year old male, he has a history of left lateral epicondylitis, this resolved with conservative treatment, unfortunately for the past couple weeks he is having increasing pain in the right lateral elbow, worse with gripping, shaking hands, he has tenderness at the common extensor tendon origin. This is all consistent with tennis elbow. Since he has failed NSAIDs, as well as a couple weeks of conservative treatment, bracing, we are going to proceed with an injection today. He will continue to do some rehab exercises. He will do light duty for the next week, return to see me in 6 weeks.

## 2020-10-28 ENCOUNTER — Ambulatory Visit: Payer: BC Managed Care – PPO | Admitting: Sports Medicine

## 2021-06-25 ENCOUNTER — Ambulatory Visit (INDEPENDENT_AMBULATORY_CARE_PROVIDER_SITE_OTHER): Payer: BC Managed Care – PPO

## 2021-06-25 ENCOUNTER — Other Ambulatory Visit: Payer: Self-pay

## 2021-06-25 ENCOUNTER — Encounter: Payer: Self-pay | Admitting: Sports Medicine

## 2021-06-25 ENCOUNTER — Ambulatory Visit (INDEPENDENT_AMBULATORY_CARE_PROVIDER_SITE_OTHER): Payer: BC Managed Care – PPO | Admitting: Sports Medicine

## 2021-06-25 VITALS — BP 126/85 | HR 69 | Ht 71.0 in | Wt 187.0 lb

## 2021-06-25 DIAGNOSIS — M542 Cervicalgia: Secondary | ICD-10-CM | POA: Diagnosis not present

## 2021-06-25 DIAGNOSIS — D229 Melanocytic nevi, unspecified: Secondary | ICD-10-CM | POA: Diagnosis not present

## 2021-06-25 DIAGNOSIS — M5412 Radiculopathy, cervical region: Secondary | ICD-10-CM

## 2021-06-25 DIAGNOSIS — Z0000 Encounter for general adult medical examination without abnormal findings: Secondary | ICD-10-CM

## 2021-06-25 DIAGNOSIS — E785 Hyperlipidemia, unspecified: Secondary | ICD-10-CM | POA: Diagnosis not present

## 2021-06-25 DIAGNOSIS — R7401 Elevation of levels of liver transaminase levels: Secondary | ICD-10-CM

## 2021-06-25 DIAGNOSIS — Z1211 Encounter for screening for malignant neoplasm of colon: Secondary | ICD-10-CM

## 2021-06-25 MED ORDER — PREDNISONE 50 MG PO TABS
ORAL_TABLET | ORAL | 0 refills | Status: DC
Start: 2021-06-25 — End: 2021-10-09

## 2021-06-25 NOTE — Assessment & Plan Note (Signed)
Jaylen has crucially got a mole mapping at [facility], he also has several sebaceous cysts. He will return at his leisure for excision of a large sebaceous cyst at the base of his neck. I would also like him to come back for punch biopsy of a suspicious nevus at his mid back. He would also like to get back to [place] rather than having to drive all the way to do so we will do a referral to one of our local dermatologist.

## 2021-06-25 NOTE — Assessment & Plan Note (Signed)
Elijah Durham has had several months of pain in his neck with radiation down to the second and third fingers, classic right C7 distribution radiculitis, starting conservatively, x-rays, prednisone, formal physical therapy, return to see me in 6 weeks, MRI for interventional planning if no better.

## 2021-06-25 NOTE — Progress Notes (Addendum)
Subjective:    CC: Annual Physical Exam  HPI:  This patient is here for their annual physical  I reviewed the past medical history, family history, social history, surgical history, and allergies today and no changes were needed.  Please see the problem list section below in epic for further details.  Past Medical History: Past Medical History:  Diagnosis Date   Low back pain with sciatica 2012   Dr Micheline Chapman   Past Surgical History: Past Surgical History:  Procedure Laterality Date   epidural steroid  2012   Dr Lindon Romp TOOTH EXTRACTION     Social History: Social History   Socioeconomic History   Marital status: Single    Spouse name: Not on file   Number of children: Not on file   Years of education: Not on file   Highest education level: Not on file  Occupational History   Not on file  Tobacco Use   Smoking status: Never   Smokeless tobacco: Never  Substance and Sexual Activity   Alcohol use: Yes    Comment:  occasionally   Drug use: No   Sexual activity: Yes  Other Topics Concern   Not on file  Social History Narrative   Not on file   Social Determinants of Health   Financial Resource Strain: Not on file  Food Insecurity: Not on file  Transportation Needs: Not on file  Physical Activity: Not on file  Stress: Not on file  Social Connections: Not on file   Family History: Family History  Problem Relation Age of Onset   Cancer Mother        breast   Hyperlipidemia Mother    Hypertension Mother    Heart disease Father    Dementia Maternal Grandmother    COPD Paternal Grandfather    Heart failure Paternal Grandfather    Cirrhosis Paternal Grandmother    Allergies: Allergies  Allergen Reactions   Aloe Vera Rash    rash   Amoxapine Rash   Amoxicillin Rash    rash   Latex Rash   Neomycin-Bacitracin Zn-Polymyx Rash   Penicillins Rash   Medications: See med rec.  Review of Systems: No headache, visual changes, nausea, vomiting,  diarrhea, constipation, dizziness, abdominal pain, skin rash, fevers, chills, night sweats, swollen lymph nodes, weight loss, chest pain, body aches, joint swelling, muscle aches, shortness of breath, mood changes, visual or auditory hallucinations.  Objective:    General: Well Developed, well nourished, and in no acute distress.  Neuro: Alert and oriented x3, extra-ocular muscles intact, sensation grossly intact. Cranial nerves II through XII are intact, motor, sensory, and coordinative functions are all intact. HEENT: Normocephalic, atraumatic, pupils equal round reactive to light, neck supple, no masses, no lymphadenopathy, thyroid nonpalpable. Oropharynx, nasopharynx, external ear canals are unremarkable. Skin: Warm and dry, no rashes noted.  There is a 1 cm hyperpigmented, variegated, irregular nevus in his mid back.  There were also a 3 cm sebaceous cyst just distal to the occiput. Cardiac: Regular rate and rhythm, no murmurs rubs or gallops.  Respiratory: Clear to auscultation bilaterally. Not using accessory muscles, speaking in full sentences.  Abdominal: Soft, nontender, nondistended, positive bowel sounds, no masses, no organomegaly.  Musculoskeletal: Shoulder, elbow, wrist, hip, knee, ankle stable, and with full range of motion.  Impression and Recommendations:    The patient was counselled, risk factors were discussed, anticipatory guidance given.  Annual physical exam Routine physical as above. Ordering Cologuard, routine labs.   Multiple  benign nevi Macrae has crucially got a mole mapping at [facility], he also has several sebaceous cysts. He will return at his leisure for excision of a large sebaceous cyst at the base of his neck. I would also like him to come back for punch biopsy of a suspicious nevus at his mid back. He would also like to get back to [place] rather than having to drive all the way to do so we will do a referral to one of our local  dermatologist.  Radiculitis of right cervical region Merry Proud has had several months of pain in his neck with radiation down to the second and third fingers, classic right C7 distribution radiculitis, starting conservatively, x-rays, prednisone, formal physical therapy, return to see me in 6 weeks, MRI for interventional planning if no better.  Transaminitis Adding hepatic ultrasound, patient does admit to liberal use of bourbon.  ___________________________________________ Gwen Her. Dianah Field, M.D., ABFM., CAQSM. Primary Care and Sports Medicine [facility]  Adjunct Professor of [employer] [facility] [employer]

## 2021-06-25 NOTE — Assessment & Plan Note (Signed)
Routine physical as above. Ordering Cologuard, routine labs.

## 2021-06-26 DIAGNOSIS — R7401 Elevation of levels of liver transaminase levels: Secondary | ICD-10-CM | POA: Insufficient documentation

## 2021-06-26 LAB — COMPREHENSIVE METABOLIC PANEL
AG Ratio: 2 (calc) (ref 1.0–2.5)
ALT: 68 U/L — ABNORMAL HIGH (ref 9–46)
AST: 54 U/L — ABNORMAL HIGH (ref 10–40)
Albumin: 4.9 g/dL (ref 3.6–5.1)
Alkaline phosphatase (APISO): 62 U/L (ref 36–130)
BUN: 15 mg/dL (ref 7–25)
CO2: 28 mmol/L (ref 20–32)
Calcium: 9.1 mg/dL (ref 8.6–10.3)
Chloride: 101 mmol/L (ref 98–110)
Creat: 0.88 mg/dL (ref 0.60–1.35)
Globulin: 2.5 g/dL (calc) (ref 1.9–3.7)
Glucose, Bld: 101 mg/dL — ABNORMAL HIGH (ref 65–99)
Potassium: 4.2 mmol/L (ref 3.5–5.3)
Sodium: 139 mmol/L (ref 135–146)
Total Bilirubin: 0.4 mg/dL (ref 0.2–1.2)
Total Protein: 7.4 g/dL (ref 6.1–8.1)

## 2021-06-26 LAB — CBC
HCT: 45.5 % (ref 38.5–50.0)
Hemoglobin: 15.6 g/dL (ref 13.2–17.1)
MCH: 31 pg (ref 27.0–33.0)
MCHC: 34.3 g/dL (ref 32.0–36.0)
MCV: 90.5 fL (ref 80.0–100.0)
MPV: 11.4 fL (ref 7.5–12.5)
Platelets: 209 10*3/uL (ref 140–400)
RBC: 5.03 10*6/uL (ref 4.20–5.80)
RDW: 12.7 % (ref 11.0–15.0)
WBC: 4.9 10*3/uL (ref 3.8–10.8)

## 2021-06-26 LAB — LIPID PANEL
Cholesterol: 223 mg/dL — ABNORMAL HIGH (ref <200)
HDL: 73 mg/dL (ref >=40)
LDL Cholesterol (Calc): 130 mg/dL (calc) — ABNORMAL HIGH
Non-HDL Cholesterol (Calc): 150 mg/dL (calc) — ABNORMAL HIGH (ref <130)
Total CHOL/HDL Ratio: 3.1 (calc) (ref <5.0)
Triglycerides: 95 mg/dL (ref <150)

## 2021-06-26 LAB — TSH: TSH: 1.33 mIU/L (ref 0.40–4.50)

## 2021-06-26 NOTE — Assessment & Plan Note (Signed)
Adding hepatic ultrasound, patient does admit to liberal use of bourbon.

## 2021-06-26 NOTE — Addendum Note (Signed)
Addended by: Silverio Decamp on: 06/26/2021 01:11 PM   Modules accepted: Orders

## 2021-07-15 ENCOUNTER — Other Ambulatory Visit: Payer: Self-pay

## 2021-07-15 ENCOUNTER — Ambulatory Visit (INDEPENDENT_AMBULATORY_CARE_PROVIDER_SITE_OTHER): Payer: BC Managed Care – PPO | Admitting: Rehabilitative and Restorative Service Providers"

## 2021-07-15 ENCOUNTER — Encounter: Payer: Self-pay | Admitting: Rehabilitative and Restorative Service Providers"

## 2021-07-15 DIAGNOSIS — R29898 Other symptoms and signs involving the musculoskeletal system: Secondary | ICD-10-CM

## 2021-07-15 DIAGNOSIS — M5412 Radiculopathy, cervical region: Secondary | ICD-10-CM | POA: Diagnosis not present

## 2021-07-15 DIAGNOSIS — R293 Abnormal posture: Secondary | ICD-10-CM | POA: Diagnosis not present

## 2021-07-15 NOTE — Patient Instructions (Signed)
Access Code: [email or web address]: [email or web address]: 07/20/2022Prepared by: Saki Legore HoltExercises  Supine Cervical Retraction with Towel - 2 x daily - 7 x weekly - 1 sets - 5-10 reps - 10 sec hold  Seated Cervical Retraction - 3 x daily - 7 x weekly - 10 reps - 1 sets  Standing Scapular Retraction - 3 x daily - 7 x weekly - 10 reps - 1 sets - 10 hold  Shoulder External Rotation and Scapular Retraction - 3 x daily - 7 x weekly - 10 reps - 1 sets - hold  Shoulder External Rotation in 45 Degrees Abduction - 2 x daily - 7 x weekly - 1-2 sets - 10 reps - 3 sec hold  Doorway Pec Stretch at 60 Degrees Abduction - 3 x daily - 7 x weekly - 3 reps - 1 sets  Doorway Pec Stretch at 90 Degrees Abduction - 3 x daily - 7 x weekly - 3 reps - 1 sets - 30 seconds hold  Doorway Pec Stretch at 120 Degrees Abduction - 3 x daily - 7 x weekly - 3 reps - 1 sets - 30 second hold hold  Hooklying Shoulder T - 2 x daily - 7 x weekly - 1 sets - 1 reps - 2-5 min hold Patient Education  TENS Unit  Posture and Body Mechanics  Office Posture

## 2021-07-15 NOTE — Therapy (Signed)
[place], [place], [address] Phone: [phone]   Fax:  [phone]  Physical Therapy Evaluation  Patient Details  Name: Elijah Durham MRN: [id number] Date of Birth: 21-Nov-1976 Referring Provider (PT): Dr Dianah Field   Encounter Date: 07/15/2021   PT End of Session - 07/15/21 0853     Visit Number 1    Number of Visits 12    Date for PT Re-Evaluation 08/25/21    PT Start Time 0759    PT Stop Time 0847    PT Time Calculation (min) 48 min    Activity Tolerance Patient tolerated treatment well             Past Medical History:  Diagnosis Date   Low back pain with sciatica 2012   Dr Micheline Chapman    Past Surgical History:  Procedure Laterality Date   epidural steroid  2012   Dr Lindon Romp TOOTH EXTRACTION      There were no vitals filed for this visit.    Subjective Assessment - 07/15/21 0802     Subjective Patient reports that he has had tennis elbow in the past 3 months. He received injection in the Rt forearm with improvement for 2-3 weeks but he started having tension and sensation of weakness with throwing or use of Rt UE. He has an "ache" at night with some numbness into the index and long fingers - symptoms improve after 15-20 min with position change but remains present. He has a "strain" in the biceps to distal forearm which is almost constant with movement.    Pertinent History HNP with [facility] lumbar spine 2016    Patient Stated Goals get rid of the arm symptoms    Currently in Pain? Yes    Pain Score 1     Pain Location Arm    Pain Orientation Right    Pain Descriptors / Indicators Tightness   weakness   Pain Type Acute pain    Pain Radiating Towards biceps to forearm    Pain Onset More than a month ago    Pain Frequency Intermittent    Aggravating Factors  use of Rt arm    Pain Relieving Factors rest - no use of Rt arm                [facility] PT Assessment -  07/15/21 0001       Assessment   Medical Diagnosis Rt Cervical radiculopathy    Referring Provider (PT) Dr Dianah Field    Onset Date/Surgical Date 04/26/21    Hand Dominance Right    Next MD Visit to schedule as needed    Prior Therapy here for LBP - [facility]      Precautions   Precautions None      Restrictions   Weight Bearing Restrictions No      Balance Screen   Has the patient fallen in the past 6 months No    Has the patient had a decrease in activity level because of a fear of falling?  No    Is the patient reluctant to leave their home because of a fear of falling?  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, occ physical labor as needed ~ 24 yrs  Leisure yard work; Office manager ~ 1x/month; couch when sitting      Observation/Other Assessments   Focus on Therapeutic Outcomes (FOTO)  52      Sensation   Additional Comments intermittent numbness to index and long fingers      Posture/Postural Control   Posture Comments head forward; shoulders rounded and elevated; slumped in chair with forward posture; head forward; shoulders rounded; head of the humerus anterior in orientation; UE in IR at side in standing      AROM   Cervical Flexion 64    Cervical Extension 53    Cervical - Right Side Bend 30 tight    Cervical - Left Side Bend 32 tight    Cervical - Right Rotation 53 tight    Cervical - Left Rotation 65      Strength   Right Shoulder Flexion 4+/5    Right Shoulder Extension 5/5    Right Shoulder ABduction 5/5    Right Shoulder External Rotation 4+/5    Right Shoulder Horizontal ABduction 5/5    Left Shoulder Flexion 5/5    Left Shoulder Extension 5/5    Left Shoulder ABduction 5/5    Left Shoulder External Rotation 5/5    Right/Left Elbow --   5/5 bilat   Right/Left Forearm --    5/5 bilat     Palpation   Spinal mobility hypomobile thoracic and cervical spine    Palpation comment muscular tightness ant/lat/post cervical musculature; pecs; upper trap; leveator; teres      Special Tests   Other special tests (+) neural tension test Rt > Lt UE's                        Objective measurements completed on examination: See above findings.       [facility] Adult PT Treatment/Exercise - 07/15/21 0001       Neuro Re-ed    Neuro Re-ed Details  working on posture and alignment in sitting and standing - use of foam roll to assist      Shoulder Exercises: Supine   Other Supine Exercises axial extension 10 sec x 5 reps head supported on pillow    Other Supine Exercises neural mobilization  ~ 1 min x 1 reps Rt      Shoulder Exercises: Standing   Other Standing Exercises axial extension 10 sec x 5; scap squeeze 10 sec x 5; L's x 10; W's x 10 with foam roll along spine      Shoulder Exercises: Stretch   Other Shoulder Stretches pec stretch 3 positions and hands on doorfacing overhead 30 sec x 1 rep each                    PT Education - 07/15/21 0843     Education Details HEP POC posture office ergo TENS DN    Person(s) Educated Patient    Methods Explanation;Demonstration;Tactile cues;Verbal cues;Handout    Comprehension Verbalized understanding;Returned demonstration;Verbal cues required;Tactile cues required                 PT Long Term Goals - 07/15/21 1048       PT LONG TERM GOAL #1   Title Improve posture and alignment with patient to demonstrate improved upright posture with posterior shoulder girdle engaged    Time 6    Period Weeks    Status New    Target Date 08/25/21      PT LONG TERM GOAL #  2   Title Patient to reports full resolution of radicular symptoms    Time 6    Status New    Target Date 08/25/21      PT LONG TERM GOAL #3   Title Patient reports ability to perform functional activities required for work  with no increase in symptoms    Time 6    Period Weeks    Status New    Target Date 08/25/21      PT LONG TERM GOAL #4   Title Independent in HEP (including aquatic program as indicated)    Time 6    Period Weeks    Status New    Target Date 08/25/21      PT LONG TERM GOAL #5   Title Improve functional limitation score to 65    Time 6    Period Weeks    Status New    Target Date 08/25/21                    Plan - 07/15/21 0854     Clinical Impression Statement Patient presents with ~ 3 month history of Rt UE radiculopathy initially presenting as epicondylitis Rt elbow which resolved for ~ 2-3 weeks with injectin. Symptoms are now more global including tingling and numbness biceps to distal forearm; sensation of weakness Rt UE; discomfort with lying down to sleep; difficulty with use of Rt UE. he has poor cervical and thoracic posture and alignment; limited, tight cervical ROM; muscular tightness to palpation Rt > Lt cervical and shoulder girdle musculature; (+) neural tension test Rt > Lt UE;decreased functional activity level. Patient will benefit fro PT to address problems identified.    Clinical Decision Making Low    Rehab Potential Good    PT Frequency 2x / week    PT Duration 6 weeks    PT Treatment/Interventions ADLs/Self Care Home Management;Aquatic Therapy;Cryotherapy;Electrical Stimulation;Iontophoresis 65m/ml Dexamethasone;Moist Heat;Traction;Ultrasound;Functional mobility training;Therapeutic activities;Therapeutic exercise;Neuromuscular re-education;Patient/family education;Manual techniques;Dry needling;Taping    PT Next Visit Plan review HEP; add manual work vs DN cervical musculature; progress with posterior shoulder strengthening; neural mobilization; stretch for extensor forearm; trial of TENS for home purchase; postural education    PT Home Exercise Plan [email or web address]    Consulted and Agree with Plan of Care Patient             Patient will benefit from  skilled therapeutic intervention in order to improve the following deficits and impairments:  Increased fascial restricitons, Impaired UE functional use, Pain, Decreased activity tolerance, Hypomobility, Impaired flexibility, Improper body mechanics, Decreased mobility, Decreased strength, Impaired sensation, Postural dysfunction  Visit Diagnosis: Radiculopathy, cervical region  Other symptoms and signs involving the musculoskeletal system  Abnormal posture     Problem List Patient Active Problem List   Diagnosis Date Noted   Transaminitis 06/26/2021   Radiculitis of right cervical region 06/25/2021   Bilateral shoulder pain 10/25/2018   Bilateral tennis elbow 10/25/2018   Family planning 10/25/2018   Hyperlipidemia 11/09/2017   Lesion of oral mucosa 11/08/2017   Sebaceous cyst 08/28/2015   Right lumbar radiculopathy 01/14/2015   Multiple benign nevi 08/05/2014   Annual physical exam 08/05/2014    Aldan Camey PNilda SimmerPT, MPH  07/15/2021, 10:54 AM  [facility] [address]Phone: [phone]  Fax:  [phone] Name: Elijah SODERLUNDMRN: [id number] of Birth: 611-30-1977

## 2021-07-20 ENCOUNTER — Ambulatory Visit (INDEPENDENT_AMBULATORY_CARE_PROVIDER_SITE_OTHER): Payer: BC Managed Care – PPO | Admitting: Rehabilitative and Restorative Service Providers"

## 2021-07-20 ENCOUNTER — Encounter: Payer: Self-pay | Admitting: Rehabilitative and Restorative Service Providers"

## 2021-07-20 ENCOUNTER — Other Ambulatory Visit: Payer: Self-pay

## 2021-07-20 DIAGNOSIS — R29898 Other symptoms and signs involving the musculoskeletal system: Secondary | ICD-10-CM | POA: Diagnosis not present

## 2021-07-20 DIAGNOSIS — R293 Abnormal posture: Secondary | ICD-10-CM | POA: Diagnosis not present

## 2021-07-20 DIAGNOSIS — M5412 Radiculopathy, cervical region: Secondary | ICD-10-CM | POA: Diagnosis not present

## 2021-07-20 NOTE — Therapy (Signed)
[place], [place], [address] Phone: [phone]   Fax:  [phone]  Physical Therapy Treatment  Patient Details  Name: Elijah Durham MRN: [id number] Date of Birth: [birth year] Referring Provider (PT): Dr Dianah Field   Encounter Date: 07/20/2021   PT End of Session - 07/20/21 0730     Visit Number 2    Number of Visits 12    Date for PT Re-Evaluation 08/25/21    PT Start Time 0725    PT Stop Time 0815    PT Time Calculation (min) 50 min    Activity Tolerance Patient tolerated treatment well             Past Medical History:  Diagnosis Date   Low back pain with sciatica 2012   Dr Micheline Chapman    Past Surgical History:  Procedure Laterality Date   epidural steroid  2012   Dr Lindon Romp TOOTH EXTRACTION      There were no vitals filed for this visit.   Subjective Assessment - 07/20/21 0728     Subjective Patient reports no change in symptoms. Did experienced numbness in the lateral calf after leaving PT at initial evaluation lasting about a day. It has not recured. Arm is OK unless he uses the arm.    Currently in Pain? Yes    Pain Score 1     Pain Location Arm    Pain Orientation Right    Pain Descriptors / Indicators Tightness    Pain Type Acute pain    Pain Onset More than a month ago    Pain Frequency Intermittent    Aggravating Factors  use of Rt arm                [facility] PT Assessment - 07/20/21 0001       Assessment   Medical Diagnosis Rt Cervical radiculopathy    Referring Provider (PT) Dr Dianah Field    Onset Date/Surgical Date 04/26/21    Hand Dominance Right    Next MD Visit to schedule as needed    Prior Therapy here for LBP - [facility]      Palpation   Palpation comment muscular tightness Rt > Lt ant/lat/post cervical musculature; tightness through the scaleni; pecs; upper trap; leveator; teres                           [facility] Adult PT  Treatment/Exercise - 07/20/21 0001       Shoulder Exercises: Supine   Other Supine Exercises axial extension 10 sec x 5 reps head supported on pillow    Other Supine Exercises neural mobilization  ~ 1 min x 1 reps Rt      Shoulder Exercises: Standing   Other Standing Exercises axial extension 10 sec x 5; scap squeeze 10 sec x 5; L's x 10; W's x 10 with foam roll along spine      Shoulder Exercises: Stretch   Other Shoulder Stretches pec stretch 3 positions and hands on doorfacing overhead 30 sec x 1 rep each      Moist Heat Therapy   Number Minutes Moist Heat 10 Minutes    Moist Heat Location Cervical;Elbow      Electrical Stimulation   Electrical Stimulation Location Rt lateral cervical; Rt extensor forearm    Electrical Stimulation Action TENS    Electrical Stimulation Parameters to tolerance    Electrical Stimulation Goals Pain;Tone  Manual Therapy   Manual therapy comments skilled palpation to assess response to Dn and manual work    Joint Mobilization cervical PA mobs    Soft tissue mobilization deep tissue work Rt > Lt ant/middle/posterior cervical musculature; upper traps; pecs;    Myofascial Release Rt lateral cervical to upper trap; spreading across with anterior shoulders/chest    Passive ROM cervical flexion; slight rotation/lateral flexion with flexion;              Trigger Point Dry Needling - 07/20/21 0001     Consent Given? Yes    Education Handout Provided Yes    Muscles Treated Head and Neck Scalenes;Cervical multifidi    Other Dry Needling Rt    Scalenes Response Palpable increased muscle length    Cervical multifidi Response Palpable increased muscle length                  PT Education - 07/20/21 0810     Education Details HEP DN    Person(s) Educated Patient    Methods Explanation;Demonstration;Tactile cues;Verbal cues;Handout    Comprehension Verbalized understanding;Returned demonstration;Verbal cues required;Tactile cues required                  PT Long Term Goals - 07/15/21 1048       PT LONG TERM GOAL #1   Title Improve posture and alignment with patient to demonstrate improved upright posture with posterior shoulder girdle engaged    Time 6    Period Weeks    Status New    Target Date 08/25/21      PT LONG TERM GOAL #2   Title Patient to reports full resolution of radicular symptoms    Time 6    Status New    Target Date 08/25/21      PT LONG TERM GOAL #3   Title Patient reports ability to perform functional activities required for work with no increase in symptoms    Time 6    Period Weeks    Status New    Target Date 08/25/21      PT LONG TERM GOAL #4   Title Independent in HEP (including aquatic program as indicated)    Time 6    Period Weeks    Status New    Target Date 08/25/21      PT LONG TERM GOAL #5   Title Improve functional limitation score to 65    Time 6    Period Weeks    Status New    Target Date 08/25/21                   Plan - 07/20/21 0731     Clinical Impression Statement No significant change in symptoms following initial evaluation and HEP. Patient has persistent Rt UE numbness and tingling with use of Rt UE. Trial of DN and manual work through the Rt > Lt cervical musculature with decreased palpable tightness noted. Added stretch for extensor forearm.    Rehab Potential Good    PT Frequency 2x / week    PT Duration 6 weeks    PT Treatment/Interventions ADLs/Self Care Home Management;Aquatic Therapy;Cryotherapy;Electrical Stimulation;Iontophoresis 21m/ml Dexamethasone;Moist Heat;Traction;Ultrasound;Functional mobility training;Therapeutic activities;Therapeutic exercise;Neuromuscular re-education;Patient/family education;Manual techniques;Dry needling;Taping    PT Next Visit Plan review HEP; assess response to manual work vs DN Rt cervical musculature; progress with posterior shoulder strengthening; continue neural mobilization; stretch for extensor  forearm; assess response to TENS for home purchase; postural education  PT Home Exercise Plan [email or web address]    Consulted and Agree with Plan of Care Patient             Patient will benefit from skilled therapeutic intervention in order to improve the following deficits and impairments:     Visit Diagnosis: Radiculopathy, cervical region  Other symptoms and signs involving the musculoskeletal system  Abnormal posture     Problem List Patient Active Problem List   Diagnosis Date Noted   Transaminitis 06/26/2021   Radiculitis of right cervical region 06/25/2021   Bilateral shoulder pain 10/25/2018   Bilateral tennis elbow 10/25/2018   Family planning 10/25/2018   Hyperlipidemia 11/09/2017   Lesion of oral mucosa 11/08/2017   Sebaceous cyst 08/28/2015   Right lumbar radiculopathy 01/14/2015   Multiple benign nevi 08/05/2014   Annual physical exam 08/05/2014    Myndi Wamble Nilda Simmer PT, MPH  07/20/2021, 8:24 AM  [facility] [place], [place], [address] Phone: [phone]   Fax:  [phone]  Name: JOHNTHOMAS LADER MRN: [id number] Date of Birth: [birth year]

## 2021-07-20 NOTE — Patient Instructions (Signed)

## 2021-07-22 ENCOUNTER — Encounter: Payer: Self-pay | Admitting: Rehabilitative and Restorative Service Providers"

## 2021-07-22 ENCOUNTER — Other Ambulatory Visit: Payer: Self-pay

## 2021-07-22 ENCOUNTER — Ambulatory Visit (INDEPENDENT_AMBULATORY_CARE_PROVIDER_SITE_OTHER): Payer: BC Managed Care – PPO | Admitting: Rehabilitative and Restorative Service Providers"

## 2021-07-22 DIAGNOSIS — M5412 Radiculopathy, cervical region: Secondary | ICD-10-CM | POA: Diagnosis not present

## 2021-07-22 DIAGNOSIS — R293 Abnormal posture: Secondary | ICD-10-CM | POA: Diagnosis not present

## 2021-07-22 DIAGNOSIS — R29898 Other symptoms and signs involving the musculoskeletal system: Secondary | ICD-10-CM | POA: Diagnosis not present

## 2021-07-22 NOTE — Therapy (Signed)
[place], [place], [address] Phone: [phone]   Fax:  [phone]  Physical Therapy Treatment  Patient Details  Name: Elijah Durham MRN: [id number] Date of Birth: 13-Dec-1976 Referring Provider (PT): Dr Dianah Field   Encounter Date: 07/22/2021   PT End of Session - 07/22/21 0716     Visit Number 3    Number of Visits 12    Date for PT Re-Evaluation 08/25/21    PT Start Time 0715    PT Stop Time 0805    PT Time Calculation (min) 50 min    Activity Tolerance Patient tolerated treatment well             Past Medical History:  Diagnosis Date   Low back pain with sciatica 2012   Dr Micheline Chapman    Past Surgical History:  Procedure Laterality Date   epidural steroid  2012   Dr Lindon Romp TOOTH EXTRACTION      There were no vitals filed for this visit.   Subjective Assessment - 07/22/21 0717     Subjective About 50% improvement in symptoms from last week. Minimal Rt UE pain. Doing exercises some at home    Currently in Pain? Yes    Pain Score 1     Pain Location Arm    Pain Orientation Right    Pain Descriptors / Indicators Tightness    Pain Type Acute pain                               [facility] Adult PT Treatment/Exercise - 07/22/21 0001       Shoulder Exercises: Supine   Other Supine Exercises axial extension 10 sec x 5 reps head supported on pillow    Other Supine Exercises neural mobilization  ~ 1 min x 1 reps Rt      Shoulder Exercises: Standing   Extension Strengthening;Both;10 reps;Theraband    Theraband Level (Shoulder Extension) Level 4 (Blue)    Row Strengthening;Both;10 reps;Theraband    Theraband Level (Shoulder Row) Level 4 (Blue)    Retraction Strengthening;Both;Theraband;20 reps    Theraband Level (Shoulder Retraction) Level 2 (Red)    Other Standing Exercises axial extension 10 sec x 5; scap squeeze 10 sec x 5; L's x 10; W's x 10 with foam  roll along spine      Shoulder Exercises: Stretch   Other Shoulder Stretches pec stretch 3 positions and hands on doorfacing overhead 30 sec x 1 rep each    Other Shoulder Stretches shoulder flexion hands over top of door      Moist Heat Therapy   Number Minutes Moist Heat 10 Minutes    Moist Heat Location Cervical;Elbow      Manual Therapy   Manual therapy comments skilled palpation to assess response to Dn and manual work    Joint Mobilization cervical PA mobs    Soft tissue mobilization deep tissue work Rt > Lt ant/middle/posterior cervical musculature; upper traps; pecs; Rt extensor forearm    Myofascial Release Rt lateral cervical to upper trap; spreading across with anterior shoulders/chest    Passive ROM cervical flexion; slight rotation/lateral flexion with flexion;              Trigger Point Dry Needling - 07/22/21 0001     Consent Given? Yes    Education Handout Provided Previously provided    Other Dry Needling Rt  Scalenes Response Palpable increased muscle length    Cervical multifidi Response Palpable increased muscle length                  PT Education - 07/22/21 0730     Education Details HEP    Person(s) Educated Patient    Methods Explanation;Demonstration;Tactile cues;Verbal cues;Handout    Comprehension Verbalized understanding;Returned demonstration;Verbal cues required;Tactile cues required                 PT Long Term Goals - 07/15/21 1048       PT LONG TERM GOAL #1   Title Improve posture and alignment with patient to demonstrate improved upright posture with posterior shoulder girdle engaged    Time 6    Period Weeks    Status New    Target Date 08/25/21      PT LONG TERM GOAL #2   Title Patient to reports full resolution of radicular symptoms    Time 6    Status New    Target Date 08/25/21      PT LONG TERM GOAL #3   Title Patient reports ability to perform functional activities required for work with no increase  in symptoms    Time 6    Period Weeks    Status New    Target Date 08/25/21      PT LONG TERM GOAL #4   Title Independent in HEP (including aquatic program as indicated)    Time 6    Period Weeks    Status New    Target Date 08/25/21      PT LONG TERM GOAL #5   Title Improve functional limitation score to 65    Time 6    Period Weeks    Status New    Target Date 08/25/21                   Plan - 07/22/21 0725     Clinical Impression Statement Good imporvement in symptoms with less radicular pain Rt UE. Good response to DN with minimal soreness. Decreased tightness through the Rt cervical musculature    Rehab Potential Good    PT Frequency 2x / week    PT Duration 6 weeks    PT Treatment/Interventions ADLs/Self Care Home Management;Aquatic Therapy;Cryotherapy;Electrical Stimulation;Iontophoresis 30m/ml Dexamethasone;Moist Heat;Traction;Ultrasound;Functional mobility training;Therapeutic activities;Therapeutic exercise;Neuromuscular re-education;Patient/family education;Manual techniques;Dry needling;Taping    PT Next Visit Plan review HEP; assess response to manual work vs DN Rt cervical musculature; progress with posterior shoulder strengthening; continue neural mobilization; stretch for extensor forearm    PT Home Exercise Plan [email or web address]    Consulted and Agree with Plan of Care Patient             Patient will benefit from skilled therapeutic intervention in order to improve the following deficits and impairments:     Visit Diagnosis: Radiculopathy, cervical region  Other symptoms and signs involving the musculoskeletal system  Abnormal posture     Problem List Patient Active Problem List   Diagnosis Date Noted   Transaminitis 06/26/2021   Radiculitis of right cervical region 06/25/2021   Bilateral shoulder pain 10/25/2018   Bilateral tennis elbow 10/25/2018   Family planning 10/25/2018   Hyperlipidemia 11/09/2017   Lesion of oral mucosa 11/08/2017    Sebaceous cyst 08/28/2015   Right lumbar radiculopathy 01/14/2015   Multiple benign nevi 08/05/2014   Annual physical exam 08/05/2014    Bradd Merlos P Melaine Mcphee PT, MPH  07/22/2021, 7:59 AM  [facility] [place], [place], [address] Phone: [phone]   Fax:  [phone]  Name: Elijah Durham MRN: [id number] Date of Birth: 1976-01-27

## 2021-07-22 NOTE — Patient Instructions (Signed)
Access Code: [email or web address] URL: [email or web address] Date: 07/22/2021 Prepared by: Gillermo Murdoch  Exercises Supine Cervical Retraction with Towel - 2 x daily - 7 x weekly - 1 sets - 5-10 reps - 10 sec hold Seated Cervical Retraction - 3 x daily - 7 x weekly - 10 reps - 1 sets Standing Scapular Retraction - 3 x daily - 7 x weekly - 10 reps - 1 sets - 10 hold Shoulder External Rotation and Scapular Retraction - 3 x daily - 7 x weekly - 10 reps - 1 sets - hold Shoulder External Rotation in 45 Degrees Abduction - 2 x daily - 7 x weekly - 1-2 sets - 10 reps - 3 sec hold Doorway Pec Stretch at 60 Degrees Abduction - 3 x daily - 7 x weekly - 3 reps - 1 sets Doorway Pec Stretch at 90 Degrees Abduction - 3 x daily - 7 x weekly - 3 reps - 1 sets - 30 seconds hold Doorway Pec Stretch at 120 Degrees Abduction - 3 x daily - 7 x weekly - 3 reps - 1 sets - 30 second hold hold Hooklying Shoulder T - 2 x daily - 7 x weekly - 1 sets - 1 reps - 2-5 min hold Seated Wrist Flexion Active Stretch with Elbow Straight - 2 x daily - 7 x weekly - 1 sets - 3 reps - 10-15 hold Shoulder External Rotation and Scapular Retraction with Resistance - 2 x daily - 7 x weekly - 10 reps - 3 sets Scapular Retraction with Resistance - 2 x daily - 7 x weekly - 10 reps - 3 sets Scapular Retraction with Resistance Advanced - 2 x daily - 7 x weekly - 10 reps - 3 sets

## 2021-07-22 NOTE — Therapy (Signed)
[place], [place], [address] Phone: [phone]   Fax:  [phone]  Physical Therapy Treatment  Patient Details  Name: Elijah Durham MRN: [id number] Date of Birth: 31-Dec-1975 Referring Provider (PT): Dr Dianah Field   Encounter Date: 07/22/2021   PT End of Session - 07/22/21 0716     Visit Number 3    Number of Visits 12    Date for PT Re-Evaluation 08/25/21    PT Start Time 0715    PT Stop Time 0805    PT Time Calculation (min) 50 min    Activity Tolerance Patient tolerated treatment well             Past Medical History:  Diagnosis Date   Low back pain with sciatica 2012   Dr Micheline Chapman    Past Surgical History:  Procedure Laterality Date   epidural steroid  2012   Dr Lindon Romp TOOTH EXTRACTION      There were no vitals filed for this visit.   Subjective Assessment - 07/22/21 0717     Subjective About 50% improvement in symptoms from last week. Minimal Rt UE pain. Doing exercises some at home    Currently in Pain? Yes    Pain Score 1     Pain Location Arm    Pain Orientation Right    Pain Descriptors / Indicators Tightness    Pain Type Acute pain                               [facility] Adult PT Treatment/Exercise - 07/22/21 0001       Shoulder Exercises: Supine   Other Supine Exercises axial extension 10 sec x 5 reps head supported on pillow    Other Supine Exercises neural mobilization  ~ 1 min x 1 reps Rt      Shoulder Exercises: Standing   Extension Strengthening;Both;10 reps;Theraband    Theraband Level (Shoulder Extension) Level 4 (Blue)    Row Strengthening;Both;10 reps;Theraband    Theraband Level (Shoulder Row) Level 4 (Blue)    Retraction Strengthening;Both;Theraband;20 reps    Theraband Level (Shoulder Retraction) Level 2 (Red)    Other Standing Exercises axial extension 10 sec x 5; scap squeeze 10 sec x 5; L's x 10; W's x 10 with foam  roll along spine      Shoulder Exercises: Stretch   Other Shoulder Stretches pec stretch 3 positions and hands on doorfacing overhead 30 sec x 1 rep each    Other Shoulder Stretches shoulder flexion hands over top of door      Moist Heat Therapy   Number Minutes Moist Heat 10 Minutes    Moist Heat Location Cervical;Elbow      Manual Therapy   Manual therapy comments skilled palpation to assess response to Dn and manual work    Joint Mobilization cervical PA mobs    Soft tissue mobilization deep tissue work Rt > Lt ant/middle/posterior cervical musculature; upper traps; pecs; Rt extensor forearm    Myofascial Release Rt lateral cervical to upper trap; spreading across with anterior shoulders/chest    Passive ROM cervical flexion; slight rotation/lateral flexion with flexion;              Trigger Point Dry Needling - 07/22/21 0001     Consent Given? Yes    Education Handout Provided Previously provided    Other Dry Needling Rt  Scalenes Response Palpable increased muscle length    Cervical multifidi Response Palpable increased muscle length                  PT Education - 07/22/21 0730     Education Details HEP    Person(s) Educated Patient    Methods Explanation;Demonstration;Tactile cues;Verbal cues;Handout    Comprehension Verbalized understanding;Returned demonstration;Verbal cues required;Tactile cues required                 PT Long Term Goals - 07/15/21 1048       PT LONG TERM GOAL #1   Title Improve posture and alignment with patient to demonstrate improved upright posture with posterior shoulder girdle engaged    Time 6    Period Weeks    Status New    Target Date 08/25/21      PT LONG TERM GOAL #2   Title Patient to reports full resolution of radicular symptoms    Time 6    Status New    Target Date 08/25/21      PT LONG TERM GOAL #3   Title Patient reports ability to perform functional activities required for work with no increase  in symptoms    Time 6    Period Weeks    Status New    Target Date 08/25/21      PT LONG TERM GOAL #4   Title Independent in HEP (including aquatic program as indicated)    Time 6    Period Weeks    Status New    Target Date 08/25/21      PT LONG TERM GOAL #5   Title Improve functional limitation score to 65    Time 6    Period Weeks    Status New    Target Date 08/25/21                   Plan - 07/22/21 0725     Clinical Impression Statement Good imporvement in symptoms with less radicular pain Rt UE. Good response to DN with minimal soreness. Decreased tightness through the Rt cervical musculature    Rehab Potential Good    PT Frequency 2x / week    PT Duration 6 weeks    PT Treatment/Interventions ADLs/Self Care Home Management;Aquatic Therapy;Cryotherapy;Electrical Stimulation;Iontophoresis 71m/ml Dexamethasone;Moist Heat;Traction;Ultrasound;Functional mobility training;Therapeutic activities;Therapeutic exercise;Neuromuscular re-education;Patient/family education;Manual techniques;Dry needling;Taping    PT Next Visit Plan review HEP; assess response to manual work vs DN Rt cervical musculature; progress with posterior shoulder strengthening; continue neural mobilization; stretch for extensor forearm    PT Home Exercise Plan [email or web address]    Consulted and Agree with Plan of Care Patient             Patient will benefit from skilled therapeutic intervention in order to improve the following deficits and impairments:     Visit Diagnosis: Radiculopathy, cervical region  Other symptoms and signs involving the musculoskeletal system  Abnormal posture     Problem List Patient Active Problem List   Diagnosis Date Noted   Transaminitis 06/26/2021   Radiculitis of right cervical region 06/25/2021   Bilateral shoulder pain 10/25/2018   Bilateral tennis elbow 10/25/2018   Family planning 10/25/2018   Hyperlipidemia 11/09/2017   Lesion of oral mucosa 11/08/2017    Sebaceous cyst 08/28/2015   Right lumbar radiculopathy 01/14/2015   Multiple benign nevi 08/05/2014   Annual physical exam 08/05/2014    Elijah Durham Elijah Durham 07/22/2021, 7:59 AM  Elijah Durham  [place], [place], [address] Phone: [phone]   Fax:  [phone]  Name: Elijah Durham MRN: [id number] Date of Birth: 07-02-1976

## 2021-07-25 DIAGNOSIS — Z1211 Encounter for screening for malignant neoplasm of colon: Secondary | ICD-10-CM | POA: Diagnosis not present

## 2021-07-27 ENCOUNTER — Encounter: Payer: Self-pay | Admitting: Rehabilitative and Restorative Service Providers"

## 2021-07-27 ENCOUNTER — Ambulatory Visit (INDEPENDENT_AMBULATORY_CARE_PROVIDER_SITE_OTHER): Payer: BC Managed Care – PPO | Admitting: Rehabilitative and Restorative Service Providers"

## 2021-07-27 ENCOUNTER — Other Ambulatory Visit: Payer: Self-pay

## 2021-07-27 DIAGNOSIS — R29898 Other symptoms and signs involving the musculoskeletal system: Secondary | ICD-10-CM | POA: Diagnosis not present

## 2021-07-27 DIAGNOSIS — R293 Abnormal posture: Secondary | ICD-10-CM | POA: Diagnosis not present

## 2021-07-27 DIAGNOSIS — M5412 Radiculopathy, cervical region: Secondary | ICD-10-CM | POA: Diagnosis not present

## 2021-07-27 LAB — COLOGUARD: Cologuard: NEGATIVE

## 2021-07-27 NOTE — Therapy (Addendum)
[place], [place], [address] Phone: [phone]   Fax:  [phone]  Physical Therapy Treatment Discharge summary  PHYSICAL THERAPY DISCHARGE SUMMARY  Visits from Start of Care: 4  Current functional level related to goals / functional outcomes: See progress note for discharge status    Remaining deficits: Unknown    Education / Equipment: HEP  Patient agrees to discharge. Patient goals were partially met. Patient is being discharged due to not returning since the last visit. Fayetta Sorenson P. Helene Kelp PT, MPH 09/10/21 3:55 PM   Patient Details  Name: Elijah Durham MRN: [id number] Date of Birth: 08-28-1976 Referring Provider (PT): Dr Dianah Field   Encounter Date: 07/27/2021   PT End of Session - 07/27/21 0716     Visit Number 4    Number of Visits 12    Date for PT Re-Evaluation 08/25/21    PT Start Time 0715    PT Stop Time 0803    PT Time Calculation (min) 48 min    Activity Tolerance Patient tolerated treatment well             Past Medical History:  Diagnosis Date   Low back pain with sciatica 2012   Dr Micheline Chapman    Past Surgical History:  Procedure Laterality Date   epidural steroid  2012   Dr Lindon Romp TOOTH EXTRACTION      There were no vitals filed for this visit.   Subjective Assessment - 07/27/21 0716     Subjective Continued improvement in the Rt UE symptoms. Now seems to be focused in the elbow area. Has doen some of the exercises - but none yesterday.    Currently in Pain? No/denies    Pain Score 0-No pain    Pain Location Arm    Pain Orientation Right    Pain Descriptors / Indicators Tightness;Discomfort    Pain Type Acute pain                [facility] PT Assessment - 07/27/21 0001       Assessment   Medical Diagnosis Rt Cervical radiculopathy    Referring Provider (PT) Dr Dianah Field    Onset Date/Surgical Date 04/26/21    Hand Dominance Right     Next MD Visit to schedule as needed    Prior Therapy here for LBP - ESI      Palpation   Palpation comment muscular tightness Rt extensor forearm -  Rt > Lt ant/lat/post cervical musculature; tightness through the scaleni; pecs; upper trap; leveator; teres -                           [facility] Adult PT Treatment/Exercise - 07/27/21 0001       Shoulder Exercises: Supine   Other Supine Exercises axial extension 10 sec x 5 reps head supported on pillow    Other Supine Exercises neural mobilization  ~ 1 min x 1 reps Rt      Shoulder Exercises: Standing   Extension Strengthening;Both;10 reps;Theraband    Theraband Level (Shoulder Extension) Level 4 (Blue)    Row Strengthening;Both;10 reps;Theraband    Theraband Level (Shoulder Row) Level 4 (Blue)    Row Limitations bow and arrow 10 reps each side blue TB    Retraction Strengthening;Both;Theraband;20 reps    Theraband Level (Shoulder Retraction) Level 4 (Blue)    Other Standing Exercises axial extension 10 sec x 5; scap squeeze  10 sec x 5; L's x 10; W's x 10 with foam roll along spine      Shoulder Exercises: ROM/Strengthening   UBE (Upper Arm Bike) L4 x 4 in alt fwd/back      Shoulder Exercises: Stretch   Wall Stretch - ABduction 2 reps;30 seconds    Wall Stretch - ABduction Limitations T at wall    Other Shoulder Stretches pec stretch 3 positions and hands on doorfacing overhead 30 sec x 1 rep each    Other Shoulder Stretches shoulder flexion hands over top of door      Moist Heat Therapy   Number Minutes Moist Heat 10 Minutes    Moist Heat Location Cervical;Elbow      Manual Therapy   Joint Mobilization cervical PA mobs    Soft tissue mobilization deep tissue work Rt > Lt ant/middle/posterior cervical musculature; upper traps; pecs; IASTM Rt extensor forearm    Myofascial Release Rt lateral cervical to upper trap; spreading across with anterior shoulders/chest    Passive ROM cervical flexion; slight rotation/lateral  flexion with flexion;                    PT Education - 07/27/21 0739     Education Details HEP    Person(s) Educated Patient    Methods Explanation;Demonstration;Tactile cues;Verbal cues;Handout    Comprehension Verbalized understanding;Returned demonstration;Verbal cues required;Tactile cues required                 PT Long Term Goals - 07/15/21 1048       PT LONG TERM GOAL #1   Title Improve posture and alignment with patient to demonstrate improved upright posture with posterior shoulder girdle engaged    Time 6    Period Weeks    Status New    Target Date 08/25/21      PT LONG TERM GOAL #2   Title Patient to reports full resolution of radicular symptoms    Time 6    Status New    Target Date 08/25/21      PT LONG TERM GOAL #3   Title Patient reports ability to perform functional activities required for work with no increase in symptoms    Time 6    Period Weeks    Status New    Target Date 08/25/21      PT LONG TERM GOAL #4   Title Independent in HEP (including aquatic program as indicated)    Time 6    Period Weeks    Status New    Target Date 08/25/21      PT LONG TERM GOAL #5   Title Improve functional limitation score to 65    Time 6    Period Weeks    Status New    Target Date 08/25/21                   Plan - 07/27/21 0723     Clinical Impression Statement Continued improvement in Rt UE symptoms. Patient reports continued discomfort in the Rt forearm. Note continued improvement in muscular tightness in the extensor forearm musculature. Patient is working on HEP but not as consistently as would be optimal.    Rehab Potential Good    PT Frequency 2x / week    PT Duration 6 weeks    PT Treatment/Interventions ADLs/Self Care Home Management;Aquatic Therapy;Cryotherapy;Electrical Stimulation;Iontophoresis 84m/ml Dexamethasone;Moist Heat;Traction;Ultrasound;Functional mobility training;Therapeutic activities;Therapeutic  exercise;Neuromuscular re-education;Patient/family education;Manual techniques;Dry needling;Taping    PT Next Visit Plan  review HEP; assess response to manual work vs DN Rt cervical musculature; progress with posterior shoulder strengthening; continue neural mobilization; stretch for extensor forearm    PT Home Exercise Plan [email or web address]    Consulted and Agree with Plan of Care Patient             Patient will benefit from skilled therapeutic intervention in order to improve the following deficits and impairments:     Visit Diagnosis: Radiculopathy, cervical region  Other symptoms and signs involving the musculoskeletal system  Abnormal posture     Problem List Patient Active Problem List   Diagnosis Date Noted   Transaminitis 06/26/2021   Radiculitis of right cervical region 06/25/2021   Bilateral shoulder pain 10/25/2018   Bilateral tennis elbow 10/25/2018   Family planning 10/25/2018   Hyperlipidemia 11/09/2017   Lesion of oral mucosa 11/08/2017   Sebaceous cyst 08/28/2015   Right lumbar radiculopathy 01/14/2015   Multiple benign nevi 08/05/2014   Annual physical exam 08/05/2014    Celyn Nilda Simmer PT, MPH  07/27/2021, 8:01 AM  [facility] [place], [place], [address] Phone: [phone]   Fax:  [phone]  Name: Elijah Durham MRN: [id number] Date of Birth: May 04, 1976

## 2021-07-27 NOTE — Patient Instructions (Signed)
Access Code: [email or web address] URL: [email or web address] Date: 07/27/2021 Prepared by: Gillermo Murdoch  Exercises Supine Cervical Retraction with Towel - 2 x daily - 7 x weekly - 1 sets - 5-10 reps - 10 sec hold Seated Cervical Retraction - 3 x daily - 7 x weekly - 10 reps - 1 sets Standing Scapular Retraction - 3 x daily - 7 x weekly - 10 reps - 1 sets - 10 hold Shoulder External Rotation and Scapular Retraction - 3 x daily - 7 x weekly - 10 reps - 1 sets - hold Shoulder External Rotation in 45 Degrees Abduction - 2 x daily - 7 x weekly - 1-2 sets - 10 reps - 3 sec hold Doorway Pec Stretch at 60 Degrees Abduction - 3 x daily - 7 x weekly - 3 reps - 1 sets Doorway Pec Stretch at 90 Degrees Abduction - 3 x daily - 7 x weekly - 3 reps - 1 sets - 30 seconds hold Doorway Pec Stretch at 120 Degrees Abduction - 3 x daily - 7 x weekly - 3 reps - 1 sets - 30 second hold hold Hooklying Shoulder T - 2 x daily - 7 x weekly - 1 sets - 1 reps - 2-5 min hold Seated Wrist Flexion Active Stretch with Elbow Straight - 2 x daily - 7 x weekly - 1 sets - 3 reps - 10-15 hold Shoulder External Rotation and Scapular Retraction with Resistance - 2 x daily - 7 x weekly - 10 reps - 3 sets Scapular Retraction with Resistance - 2 x daily - 7 x weekly - 10 reps - 3 sets Scapular Retraction with Resistance Advanced - 2 x daily - 7 x weekly - 10 reps - 3 sets Seated Thoracic Self Mobilization - 2 x daily - 7 x weekly - 1 sets - 3 reps - 30 sec hold Hooklying Shoulder T - 2 x daily - 7 x weekly - 1 sets - 1 reps - 2-5 min hold

## 2021-07-27 NOTE — Therapy (Signed)
[place], [place], [address] Phone: [phone]   Fax:  [phone]  Physical Therapy Treatment  Patient Details  Name: Elijah Durham MRN: [id number] Date of Birth: 1976-07-02 Referring Provider (PT): Dr Dianah Field   Encounter Date: 07/27/2021   PT End of Session - 07/27/21 0716     Visit Number 4    Number of Visits 12    Date for PT Re-Evaluation 08/25/21    PT Start Time 0715    PT Stop Time 0803    PT Time Calculation (min) 48 min    Activity Tolerance Patient tolerated treatment well             Past Medical History:  Diagnosis Date   Low back pain with sciatica 2012   Dr Micheline Chapman    Past Surgical History:  Procedure Laterality Date   epidural steroid  2012   Dr Lindon Romp TOOTH EXTRACTION      There were no vitals filed for this visit.   Subjective Assessment - 07/27/21 0716     Subjective Continued improvement in the Rt UE symptoms. Now seems to be focused in the elbow area. Has doen some of the exercises - but none yesterday.    Currently in Pain? No/denies    Pain Score 0-No pain    Pain Location Arm    Pain Orientation Right    Pain Descriptors / Indicators Tightness;Discomfort    Pain Type Acute pain                [facility] PT Assessment - 07/27/21 0001       Assessment   Medical Diagnosis Rt Cervical radiculopathy    Referring Provider (PT) Dr Dianah Field    Onset Date/Surgical Date 04/26/21    Hand Dominance Right    Next MD Visit to schedule as needed    Prior Therapy here for LBP - ESI      Palpation   Palpation comment muscular tightness Rt extensor forearm -  Rt > Lt ant/lat/post cervical musculature; tightness through the scaleni; pecs; upper trap; leveator; teres -                           [facility] Adult PT Treatment/Exercise - 07/27/21 0001       Shoulder Exercises: Supine   Other Supine Exercises axial extension 10 sec  x 5 reps head supported on pillow    Other Supine Exercises neural mobilization  ~ 1 min x 1 reps Rt      Shoulder Exercises: Standing   Extension Strengthening;Both;10 reps;Theraband    Theraband Level (Shoulder Extension) Level 4 (Blue)    Row Strengthening;Both;10 reps;Theraband    Theraband Level (Shoulder Row) Level 4 (Blue)    Row Limitations bow and arrow 10 reps each side blue TB    Retraction Strengthening;Both;Theraband;20 reps    Theraband Level (Shoulder Retraction) Level 4 (Blue)    Other Standing Exercises axial extension 10 sec x 5; scap squeeze 10 sec x 5; L's x 10; W's x 10 with foam roll along spine      Shoulder Exercises: ROM/Strengthening   UBE (Upper Arm Bike) L4 x 4 in alt fwd/back      Shoulder Exercises: Stretch   Wall Stretch - ABduction 2 reps;30 seconds    Wall Stretch - ABduction Limitations T at wall    Other Shoulder Stretches pec stretch 3 positions and  hands on doorfacing overhead 30 sec x 1 rep each    Other Shoulder Stretches shoulder flexion hands over top of door      Moist Heat Therapy   Number Minutes Moist Heat 10 Minutes    Moist Heat Location Cervical;Elbow      Manual Therapy   Joint Mobilization cervical PA mobs    Soft tissue mobilization deep tissue work Rt > Lt ant/middle/posterior cervical musculature; upper traps; pecs; IASTM Rt extensor forearm    Myofascial Release Rt lateral cervical to upper trap; spreading across with anterior shoulders/chest    Passive ROM cervical flexion; slight rotation/lateral flexion with flexion;                    PT Education - 07/27/21 0739     Education Details HEP    Person(s) Educated Patient    Methods Explanation;Demonstration;Tactile cues;Verbal cues;Handout    Comprehension Verbalized understanding;Returned demonstration;Verbal cues required;Tactile cues required                 PT Long Term Goals - 07/15/21 1048       PT LONG TERM GOAL #1   Title Improve posture and  alignment with patient to demonstrate improved upright posture with posterior shoulder girdle engaged    Time 6    Period Weeks    Status New    Target Date 08/25/21      PT LONG TERM GOAL #2   Title Patient to reports full resolution of radicular symptoms    Time 6    Status New    Target Date 08/25/21      PT LONG TERM GOAL #3   Title Patient reports ability to perform functional activities required for work with no increase in symptoms    Time 6    Period Weeks    Status New    Target Date 08/25/21      PT LONG TERM GOAL #4   Title Independent in HEP (including aquatic program as indicated)    Time 6    Period Weeks    Status New    Target Date 08/25/21      PT LONG TERM GOAL #5   Title Improve functional limitation score to 65    Time 6    Period Weeks    Status New    Target Date 08/25/21                   Plan - 07/27/21 0723     Clinical Impression Statement Continued improvement in Rt UE symptoms. Patient reports continued discomfort in the Rt forearm. Note continued improvement in muscular tightness in the extensor forearm musculature. Patient is working on HEP but not as consistently as would be optimal.    Rehab Potential Good    PT Frequency 2x / week    PT Duration 6 weeks    PT Treatment/Interventions ADLs/Self Care Home Management;Aquatic Therapy;Cryotherapy;Electrical Stimulation;Iontophoresis 46m/ml Dexamethasone;Moist Heat;Traction;Ultrasound;Functional mobility training;Therapeutic activities;Therapeutic exercise;Neuromuscular re-education;Patient/family education;Manual techniques;Dry needling;Taping    PT Next Visit Plan review HEP; assess response to manual work vs DN Rt cervical musculature; progress with posterior shoulder strengthening; continue neural mobilization; stretch for extensor forearm    PT Home Exercise Plan [id number]    Consulted and Agree with Plan of Care Patient             Patient will benefit from skilled  therapeutic intervention in order to improve the following deficits and impairments:  Visit Diagnosis: Radiculopathy, cervical region  Other symptoms and signs involving the musculoskeletal system  Abnormal posture     Problem List Patient Active Problem List   Diagnosis Date Noted   Transaminitis 06/26/2021   Radiculitis of right cervical region 06/25/2021   Bilateral shoulder pain 10/25/2018   Bilateral tennis elbow 10/25/2018   Family planning 10/25/2018   Hyperlipidemia 11/09/2017   Lesion of oral mucosa 11/08/2017   Sebaceous cyst 08/28/2015   Right lumbar radiculopathy 01/14/2015   Multiple benign nevi 08/05/2014   Annual physical exam 08/05/2014    Hrishikesh Hoeg P Helene Kelp 07/27/2021, 8:01 AM  [facility] [place], [place], [address] Phone: [phone]   Fax:  [phone]  Name: Elijah Durham MRN: [id number] Date of Birth: 08-Jun-1976

## 2021-07-28 DIAGNOSIS — L578 Other skin changes due to chronic exposure to nonionizing radiation: Secondary | ICD-10-CM | POA: Diagnosis not present

## 2021-07-28 DIAGNOSIS — L7211 Pilar cyst: Secondary | ICD-10-CM | POA: Diagnosis not present

## 2021-07-28 DIAGNOSIS — D485 Neoplasm of uncertain behavior of skin: Secondary | ICD-10-CM | POA: Diagnosis not present

## 2021-07-28 DIAGNOSIS — L821 Other seborrheic keratosis: Secondary | ICD-10-CM | POA: Diagnosis not present

## 2021-07-29 ENCOUNTER — Encounter: Payer: BC Managed Care – PPO | Admitting: Rehabilitative and Restorative Service Providers"

## 2021-08-02 LAB — COLOGUARD: COLOGUARD: NEGATIVE

## 2021-08-03 ENCOUNTER — Encounter: Payer: BC Managed Care – PPO | Admitting: Rehabilitative and Restorative Service Providers"

## 2021-08-05 ENCOUNTER — Encounter: Payer: BC Managed Care – PPO | Admitting: Rehabilitative and Restorative Service Providers"

## 2021-10-06 ENCOUNTER — Ambulatory Visit: Payer: BC Managed Care – PPO | Admitting: Sports Medicine

## 2021-10-09 ENCOUNTER — Ambulatory Visit (INDEPENDENT_AMBULATORY_CARE_PROVIDER_SITE_OTHER): Payer: BC Managed Care – PPO | Admitting: Sports Medicine

## 2021-10-09 ENCOUNTER — Encounter: Payer: Self-pay | Admitting: Sports Medicine

## 2021-10-09 DIAGNOSIS — R1013 Epigastric pain: Secondary | ICD-10-CM | POA: Insufficient documentation

## 2021-10-09 DIAGNOSIS — R7401 Elevation of levels of liver transaminase levels: Secondary | ICD-10-CM

## 2021-10-09 NOTE — Progress Notes (Addendum)
    Procedures performed today:    None.  Independent interpretation of notes and tests performed by another provider:   None.  Brief History, Exam, Impression, and Recommendations:    Midepigastric pain Merry Proud is a very pleasant 45 year old male, we have historically treated him for some transaminitis, he has cut back his alcohol intake to approximately 1 bourbon per night from 3 bourbons per night. He had a single episode of aching mid thoracic pain with radiation straight to the anterior epigastric region. Did not note any concurrent symptoms such as nausea, colicky pain, vomiting, diarrhea, melena, hematochezia. Food did not worsen or better his pain. He did endorse some bloating during the episode. This resolved spontaneously. Is exam is completely benign today, I do suspect more of a dyspepsia type picture/peptic ulcer disease, as this has resolved we have discussed preventative measures including decreasing use of NSAIDs, acid blockade, decreasing alcohol use. We will check some labs including CBC, CMP, amylase, lipase, I would like an abdominal ultrasound. Return to see me as needed for this.  Transaminitis Noted fatty liver disease, there is a lesion in the liver as well that is likely a hemangioma, we do need to do another liver ultrasound in 6 months to ensure stability.  Repeat hepatic ultrasound January to February of 2023.    ___________________________________________ Gwen Her. Dianah Field, M.D., ABFM., CAQSM. Primary Care and [place] Instructor of [employer] [facility] [employer] [employer]

## 2021-10-09 NOTE — Assessment & Plan Note (Signed)
Merry Proud is a very pleasant 45 year old male, we have historically treated him for some transaminitis, he has cut back his alcohol intake to approximately 1 bourbon per night from 3 bourbons per night. He had a single episode of aching mid thoracic pain with radiation straight to the anterior epigastric region. Did not note any concurrent symptoms such as nausea, colicky pain, vomiting, diarrhea, melena, hematochezia. Food did not worsen or better his pain. He did endorse some bloating during the episode. This resolved spontaneously. Is exam is completely benign today, I do suspect more of a dyspepsia type picture/peptic ulcer disease, as this has resolved we have discussed preventative measures including decreasing use of NSAIDs, acid blockade, decreasing alcohol use. We will check some labs including CBC, CMP, amylase, lipase, I would like an abdominal ultrasound. Return to see me as needed for this.

## 2021-10-10 LAB — COMPREHENSIVE METABOLIC PANEL
AG Ratio: 1.9 (calc) (ref 1.0–2.5)
ALT: 50 U/L — ABNORMAL HIGH (ref 9–46)
AST: 26 U/L (ref 10–40)
Albumin: 4.7 g/dL (ref 3.6–5.1)
Alkaline phosphatase (APISO): 87 U/L (ref 36–130)
BUN: 17 mg/dL (ref 7–25)
CO2: 28 mmol/L (ref 20–32)
Calcium: 9.8 mg/dL (ref 8.6–10.3)
Chloride: 104 mmol/L (ref 98–110)
Creat: 0.93 mg/dL (ref 0.60–1.29)
Globulin: 2.5 g/dL (calc) (ref 1.9–3.7)
Glucose, Bld: 101 mg/dL — ABNORMAL HIGH (ref 65–99)
Potassium: 4.5 mmol/L (ref 3.5–5.3)
Sodium: 143 mmol/L (ref 135–146)
Total Bilirubin: 0.4 mg/dL (ref 0.2–1.2)
Total Protein: 7.2 g/dL (ref 6.1–8.1)

## 2021-10-10 LAB — CBC
HCT: 45.3 % (ref 38.5–50.0)
Hemoglobin: 15.4 g/dL (ref 13.2–17.1)
MCH: 31.2 pg (ref 27.0–33.0)
MCHC: 34 g/dL (ref 32.0–36.0)
MCV: 91.7 fL (ref 80.0–100.0)
MPV: 11.1 fL (ref 7.5–12.5)
Platelets: 295 10*3/uL (ref 140–400)
RBC: 4.94 10*6/uL (ref 4.20–5.80)
RDW: 12.5 % (ref 11.0–15.0)
WBC: 6.2 10*3/uL (ref 3.8–10.8)

## 2021-10-10 LAB — LIPASE: Lipase: 43 U/L (ref 7–60)

## 2021-10-10 LAB — AMYLASE: Amylase: 37 U/L (ref 21–101)

## 2021-10-13 ENCOUNTER — Other Ambulatory Visit: Payer: Self-pay

## 2021-10-13 ENCOUNTER — Ambulatory Visit (INDEPENDENT_AMBULATORY_CARE_PROVIDER_SITE_OTHER): Payer: BC Managed Care – PPO

## 2021-10-13 DIAGNOSIS — R1013 Epigastric pain: Secondary | ICD-10-CM | POA: Diagnosis not present

## 2021-10-13 DIAGNOSIS — K760 Fatty (change of) liver, not elsewhere classified: Secondary | ICD-10-CM | POA: Diagnosis not present

## 2021-10-13 DIAGNOSIS — K824 Cholesterolosis of gallbladder: Secondary | ICD-10-CM | POA: Diagnosis not present

## 2021-10-14 NOTE — Assessment & Plan Note (Signed)
Noted fatty liver disease, there is a lesion in the liver as well that is likely a hemangioma, we do need to do another liver ultrasound in 6 months to ensure stability.  Repeat hepatic ultrasound January to February of 2023.

## 2021-10-14 NOTE — Addendum Note (Signed)
Addended by: Silverio Decamp on: 10/14/2021 12:07 PM   Modules accepted: Orders

## 2023-01-12 DIAGNOSIS — L578 Other skin changes due to chronic exposure to nonionizing radiation: Secondary | ICD-10-CM | POA: Diagnosis not present

## 2023-01-12 DIAGNOSIS — L821 Other seborrheic keratosis: Secondary | ICD-10-CM | POA: Diagnosis not present

## 2023-01-12 DIAGNOSIS — L720 Epidermal cyst: Secondary | ICD-10-CM | POA: Diagnosis not present

## 2023-01-12 DIAGNOSIS — D225 Melanocytic nevi of trunk: Secondary | ICD-10-CM | POA: Diagnosis not present

## 2023-01-18 DIAGNOSIS — L720 Epidermal cyst: Secondary | ICD-10-CM | POA: Diagnosis not present

## 2023-02-14 DIAGNOSIS — R21 Rash and other nonspecific skin eruption: Secondary | ICD-10-CM | POA: Diagnosis not present

## 2023-02-14 DIAGNOSIS — L986 Other infiltrative disorders of the skin and subcutaneous tissue: Secondary | ICD-10-CM | POA: Diagnosis not present

## 2023-02-14 DIAGNOSIS — L299 Pruritus, unspecified: Secondary | ICD-10-CM | POA: Diagnosis not present

## 2023-02-14 DIAGNOSIS — L0100 Impetigo, unspecified: Secondary | ICD-10-CM | POA: Diagnosis not present

## 2023-03-02 DIAGNOSIS — L131 Subcorneal pustular dermatitis: Secondary | ICD-10-CM | POA: Diagnosis not present

## 2023-06-07 IMAGING — DX DG CERVICAL SPINE COMPLETE 4+V
7 series · 7 of 7 positions shown · non-contrast
Comparison: None.

CLINICAL DATA: Several months of neck pain radiating to
hand/fingers.

EXAM:
CERVICAL SPINE - COMPLETE 4+ VIEW

[c-spine lat (1 of 2)]
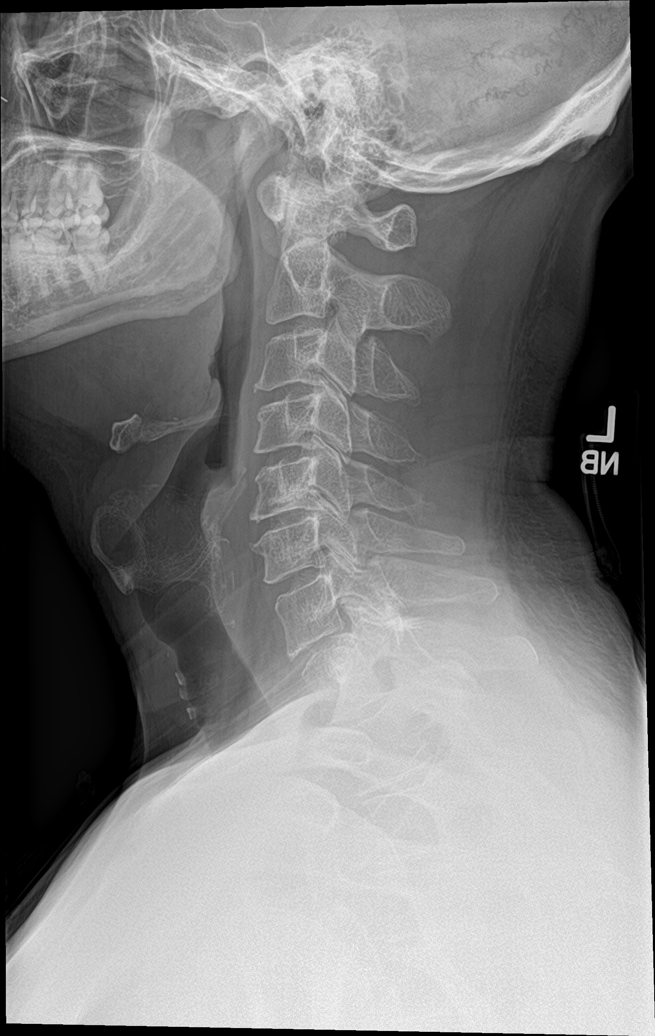

[c-spine lat (2 of 2)]
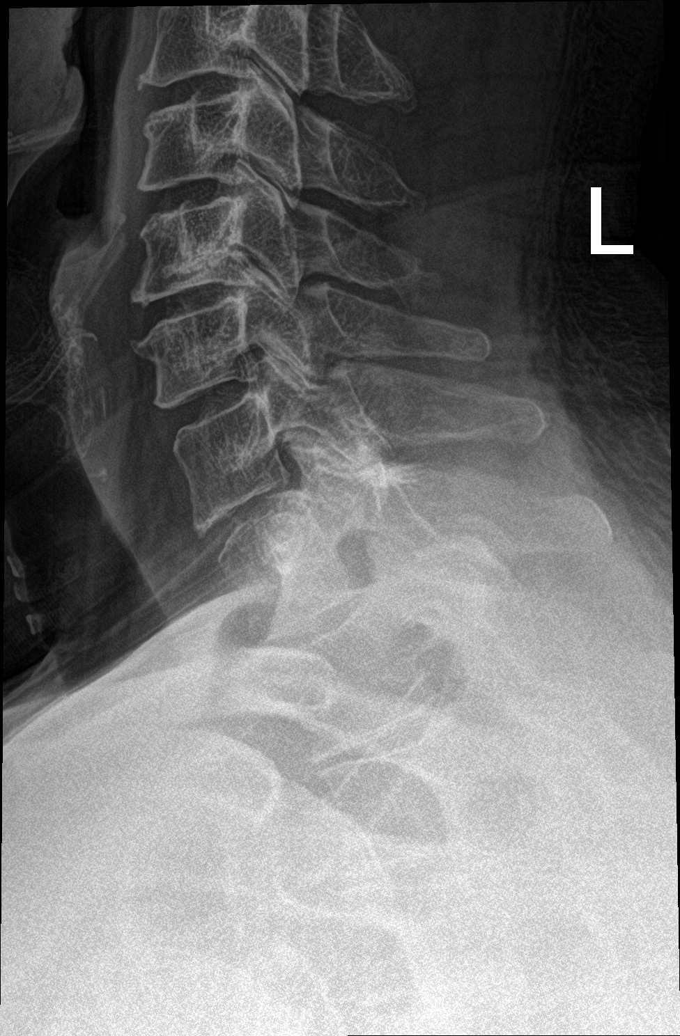

[c-spine obl (1 of 2)]
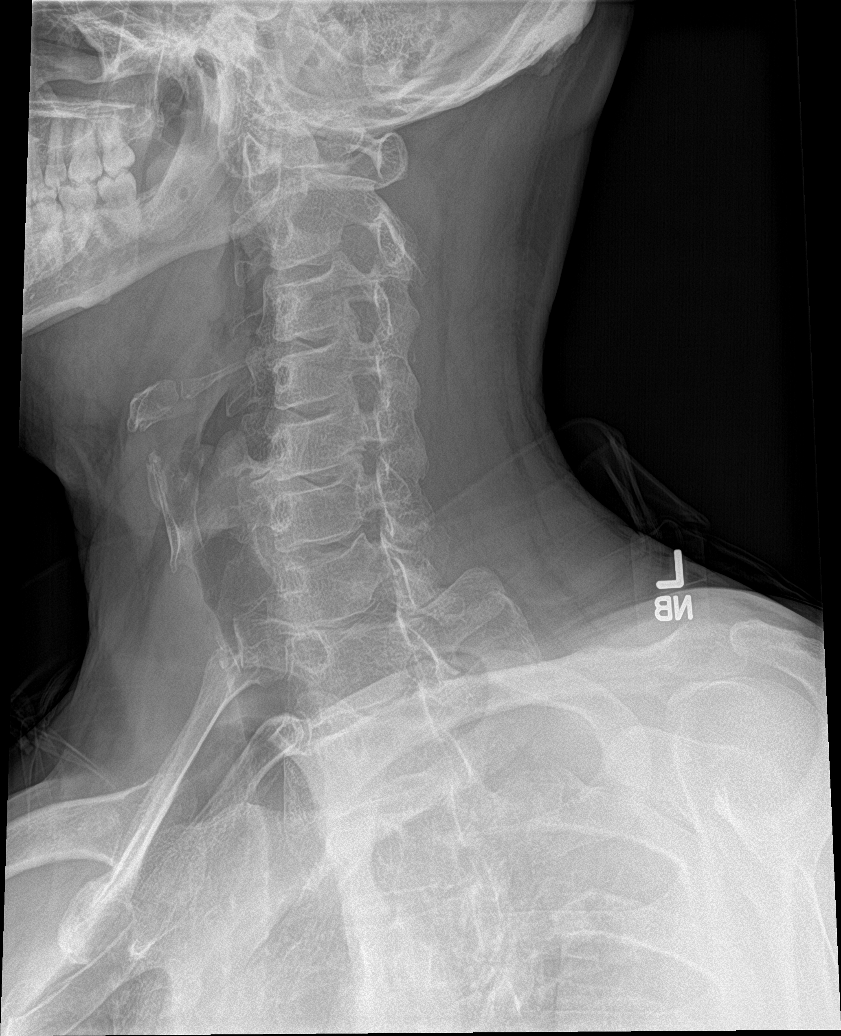

[c-spine obl (2 of 2)]
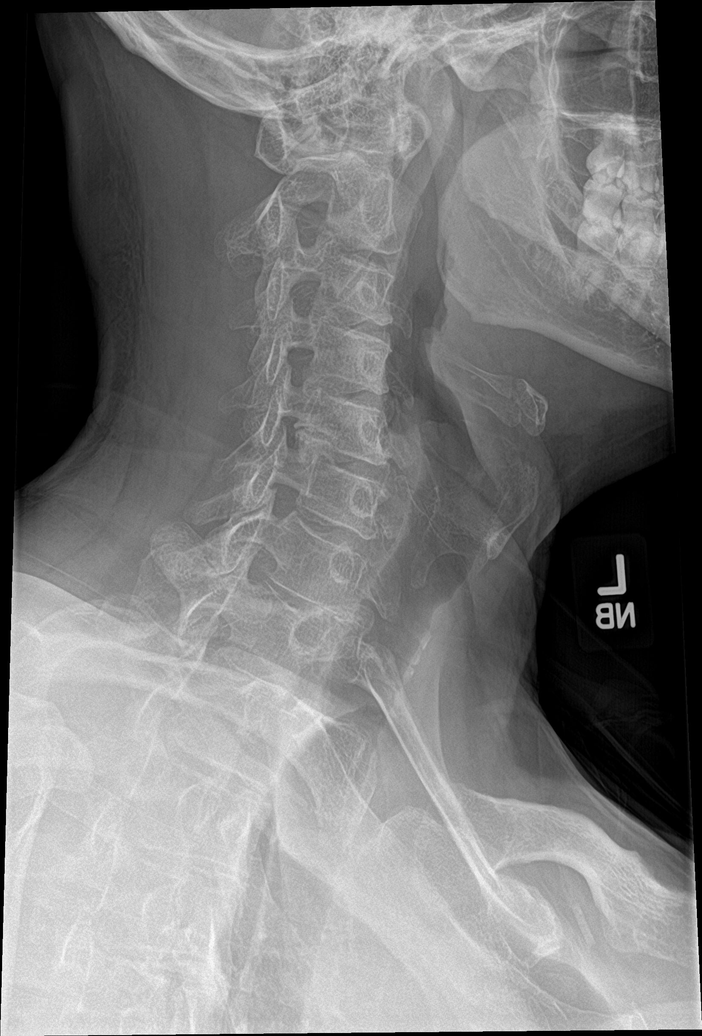

[c-spine ap]
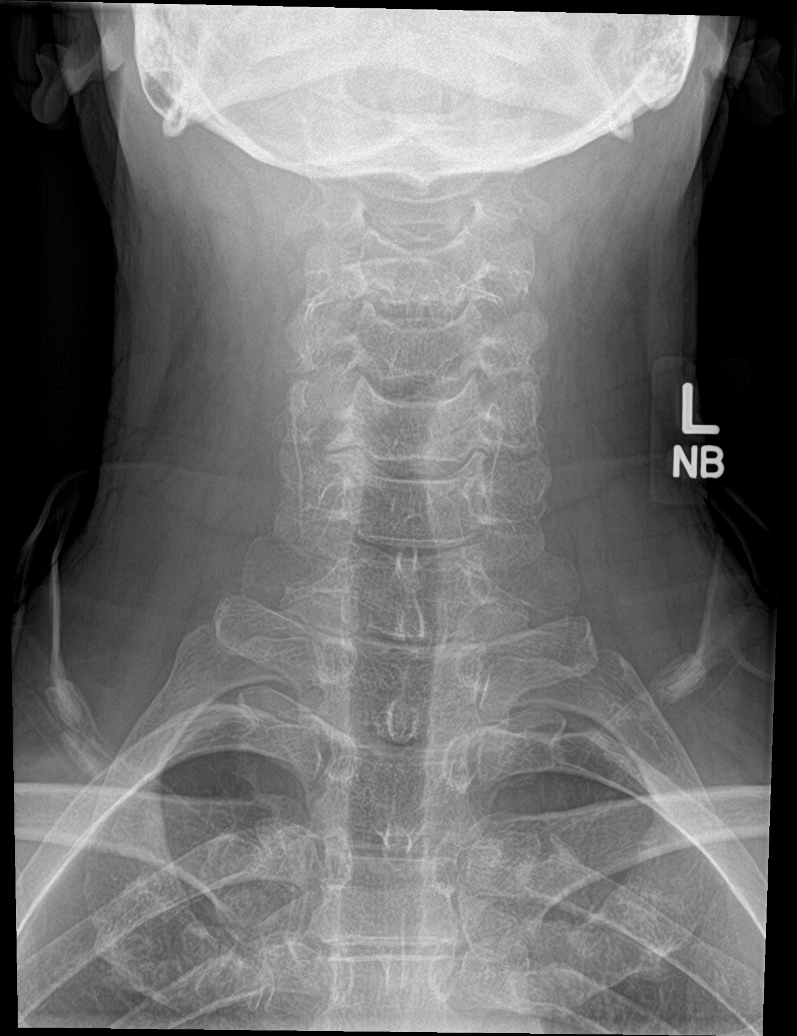

[c-spine open mouth (1 of 2)]
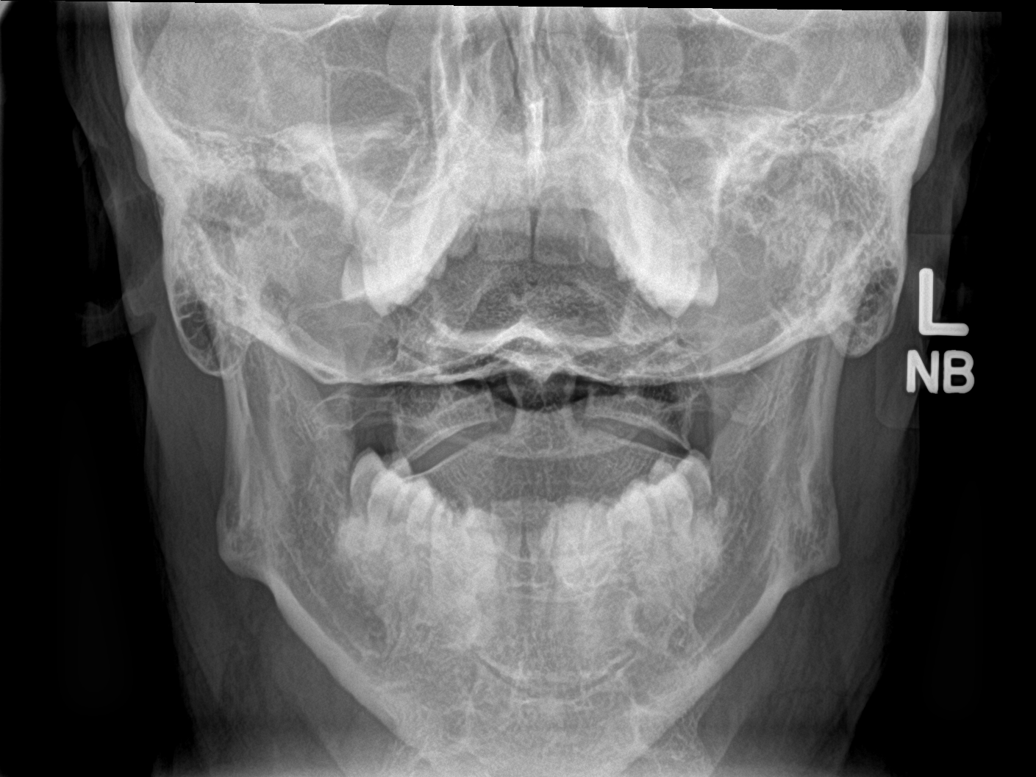

[c-spine open mouth (2 of 2)]
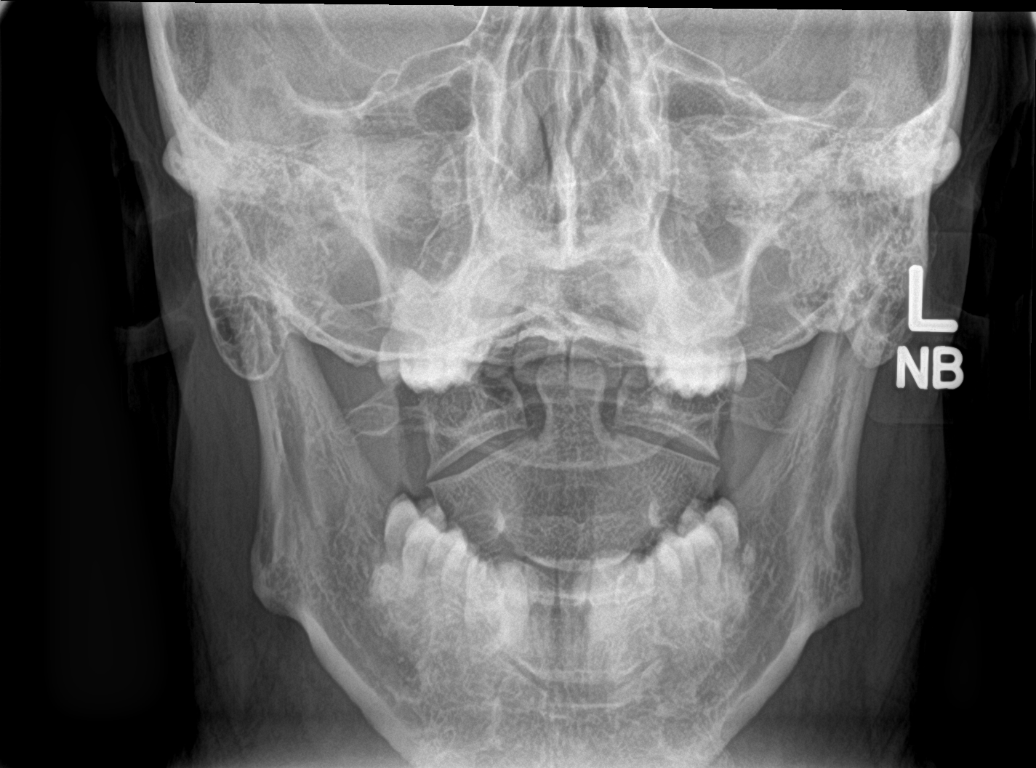

[7 of 7 positions shown; findings below may reference images not displayed]

FINDINGS: Vertebral body alignment and heights are normal. Subtle disc space
narrowing at the C5-6 level. Mild spondylosis of the cervical spine.
Prevertebral soft tissues are normal. Moderate neural foraminal
narrowing at the C5-6 level right worse than left. Mild
uncovertebral joint spurring worse over the right side at the C5-6
level. Atlantoaxial articulation is normal.
IMPRESSION: Mild spondylosis of the cervical spine with minimal disc disease at
the C5-6 level. Moderate bilateral neural foraminal narrowing at the
C5-6 level right worse than left.

## 2023-09-25 IMAGING — US US ABDOMEN COMPLETE
2 series · 13 of 25 positions shown · non-contrast
Comparison: None.

CLINICAL DATA: With epigastric pain

EXAM:
ABDOMEN ULTRASOUND COMPLETE

[Series 1: us abdomen complete · 12 of 100 slices shown]
[im 1/100]
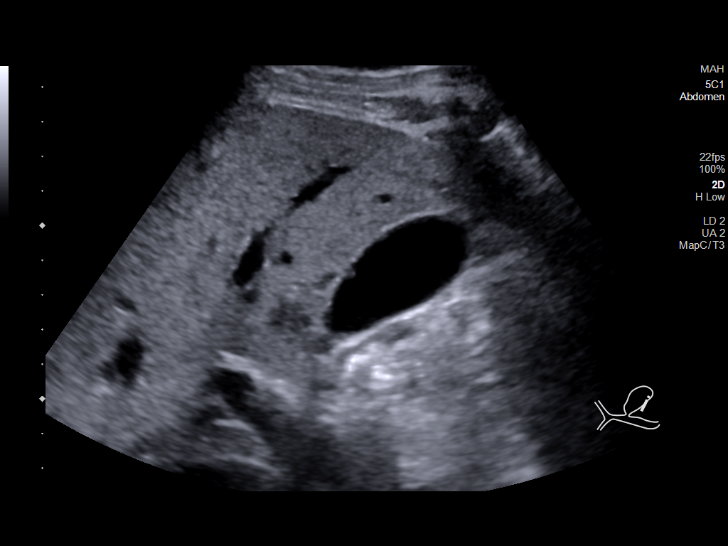
[im 9/100]
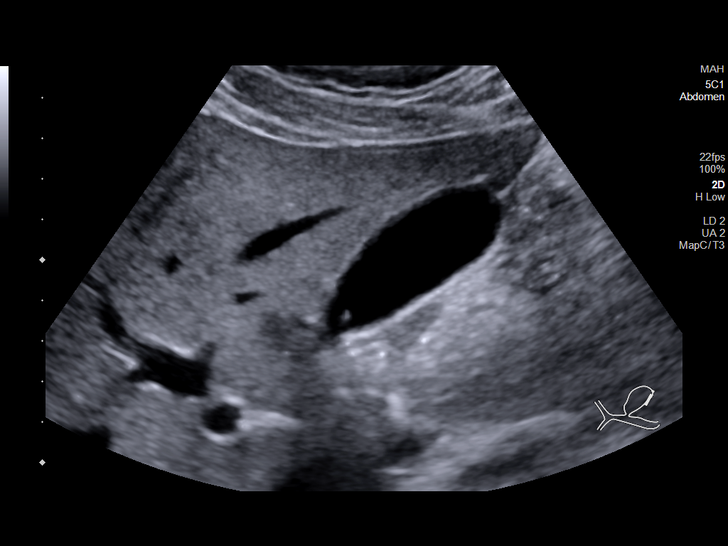
[im 18/100]
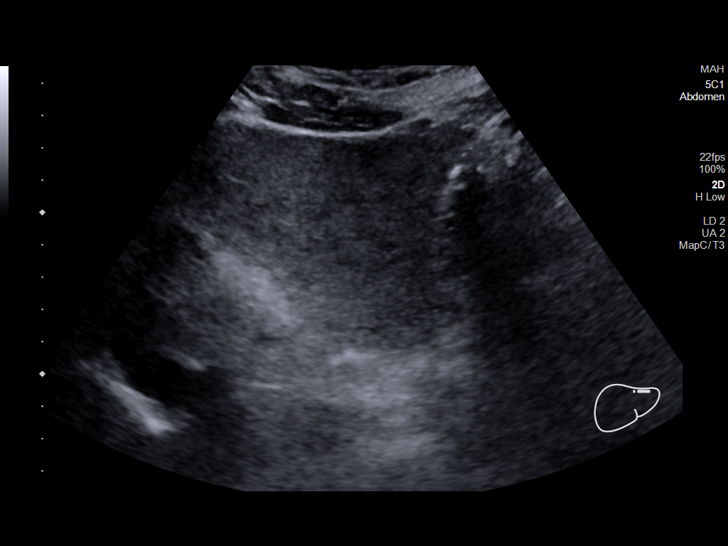
[im 26/100]
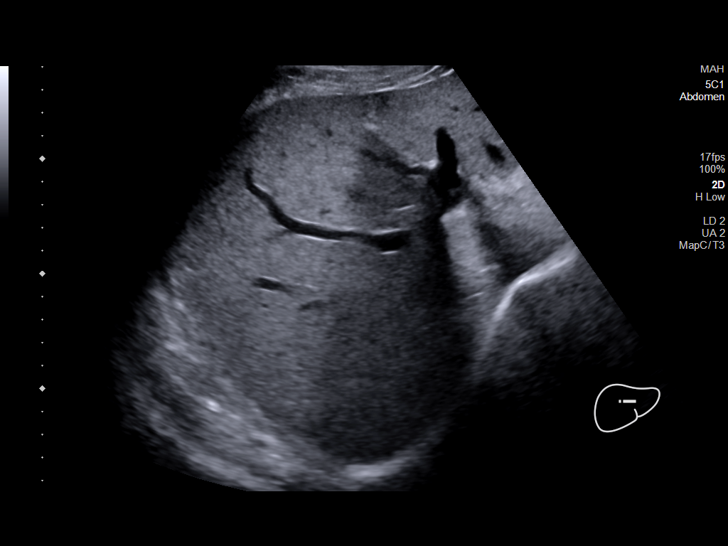
[im 35/100]
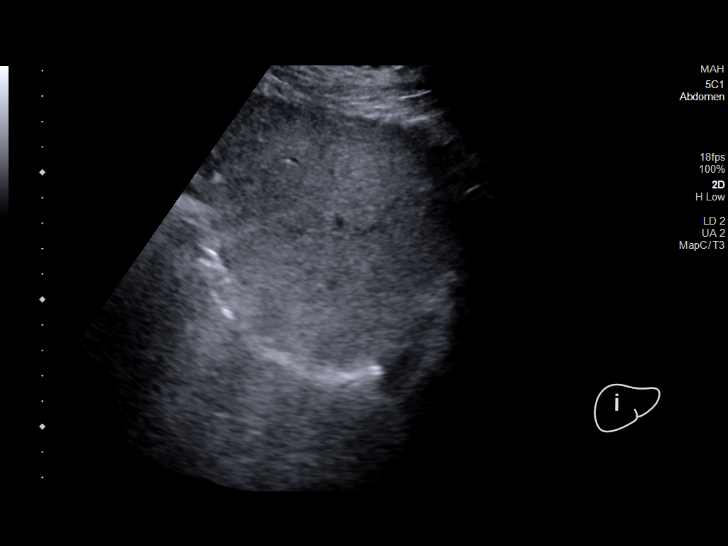
[im 44/100]
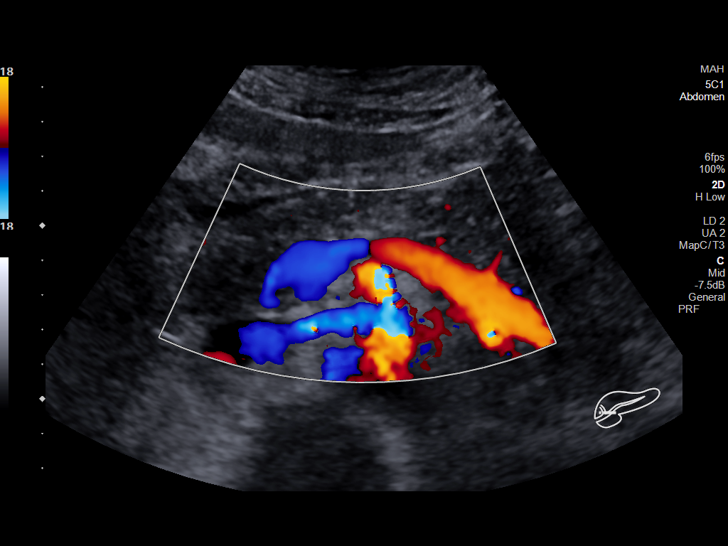
[im 52/100]
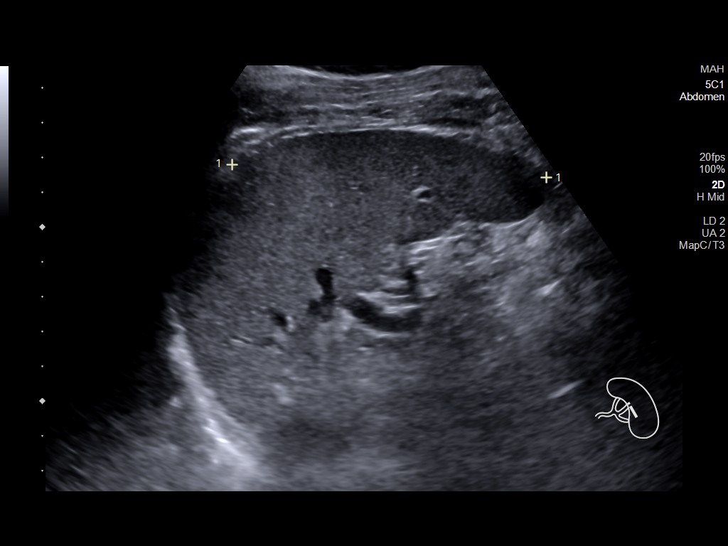
[im 61/100]
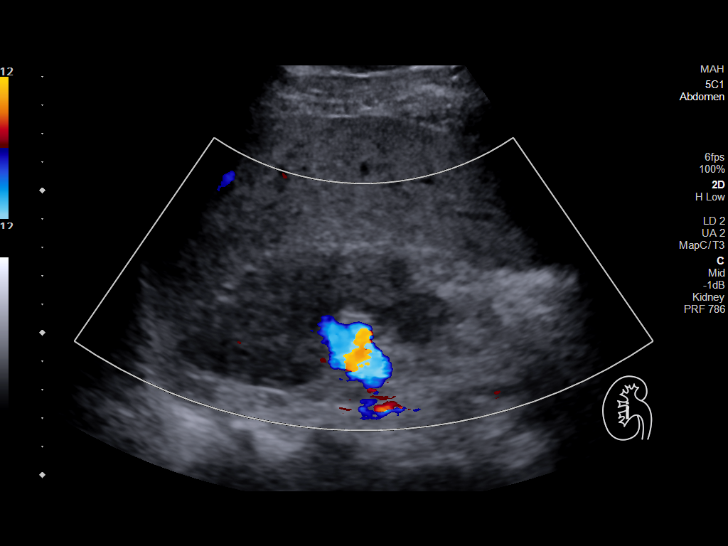
[im 69/100]
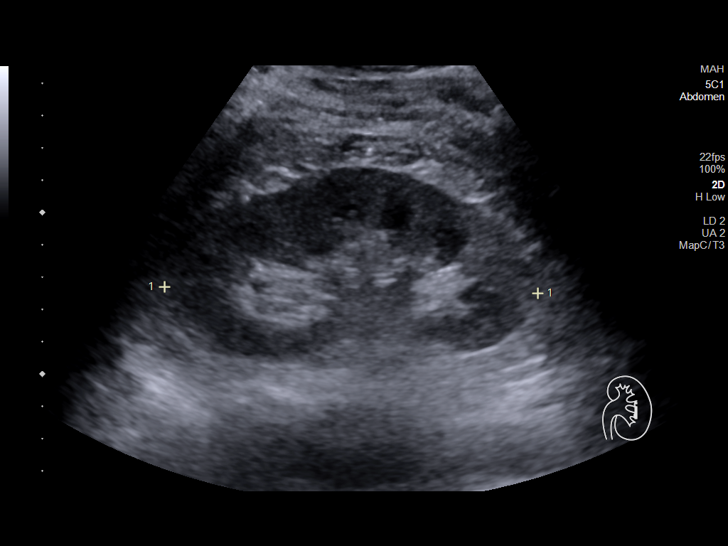
[im 78/100]
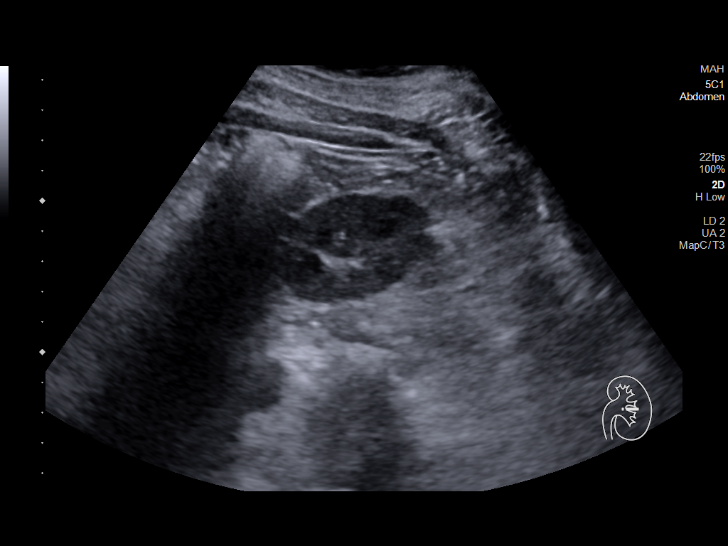
[im 87/100]
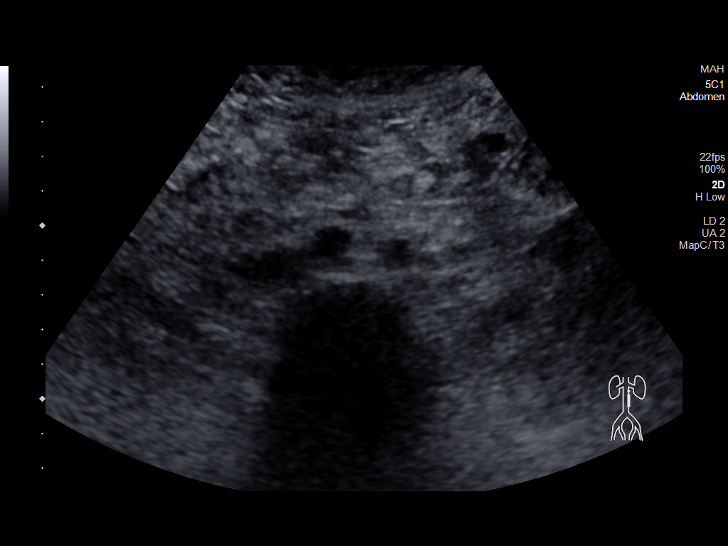
[im 95/100]
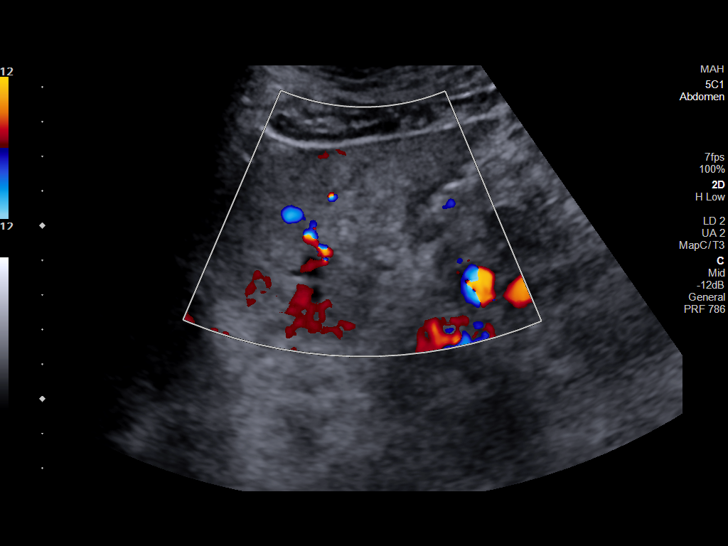

[Series 1001: abdomen · 1 of 2 slices shown]
[im 1/2]
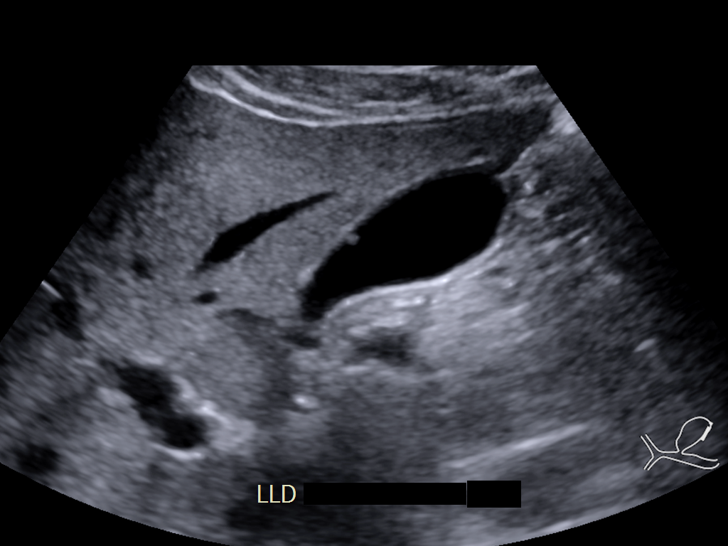

[13 of 25 positions shown; findings below may reference images not displayed]

FINDINGS: Gallbladder: Gallbladder is well distended. No wall thickening or
pericholecystic fluid is noted. 3 mm polyp is noted.

Common bile duct: Diameter: 0.6 mm.

Liver: Increased in echogenicity consistent with fatty infiltration.
A focal 2.8 x 3.1 cm area of increased echogenicity is noted within
the left lobe inferiorly likely representing a focal hemangioma.
Portal vein is patent on color Doppler imaging with normal direction
of blood flow towards the liver.

IVC: No abnormality visualized.

Pancreas: Visualized portion unremarkable.

Spleen: Size and appearance within normal limits.

Right Kidney: Length: 11.8 cm. Echogenicity within normal limits.
No mass or hydronephrosis visualized.

Left Kidney: Length: 11.6 cm. Echogenicity within normal limits. No
mass or hydronephrosis visualized.

Abdominal aorta: No aneurysm visualized.

Other findings: None.
IMPRESSION: Hyperechoic lesion within the left lobe of the liver likely
representing a hemangioma. Given the lack of prior exams a follow-up
ultrasound in 6 months is recommended to assess for stability.

Gallbladder polyp without complicating factors.

Fatty liver.

## 2024-08-28 ENCOUNTER — Encounter: Payer: Self-pay | Admitting: Sports Medicine

## 2024-10-11 ENCOUNTER — Other Ambulatory Visit: Payer: Self-pay

## 2024-10-11 ENCOUNTER — Encounter: Payer: Self-pay | Admitting: Medical-Surgical

## 2024-10-11 DIAGNOSIS — Z1211 Encounter for screening for malignant neoplasm of colon: Secondary | ICD-10-CM
